# Patient Record
Sex: Male | Born: 1960 | Race: White | Hispanic: No | Marital: Married | Smoking: Never smoker
Health system: Southern US, Community
[De-identification: ages and names within clinical notes are randomized; demographics above are authoritative.]

## PROBLEM LIST (undated history)

## (undated) ENCOUNTER — Ambulatory Visit: Payer: Self-pay

## (undated) DIAGNOSIS — N419 Inflammatory disease of prostate, unspecified: Secondary | ICD-10-CM

## (undated) DIAGNOSIS — N529 Male erectile dysfunction, unspecified: Secondary | ICD-10-CM

## (undated) DIAGNOSIS — K219 Gastro-esophageal reflux disease without esophagitis: Secondary | ICD-10-CM

## (undated) DIAGNOSIS — N400 Enlarged prostate without lower urinary tract symptoms: Secondary | ICD-10-CM

## (undated) DIAGNOSIS — N320 Bladder-neck obstruction: Secondary | ICD-10-CM

## (undated) HISTORY — DX: Gastro-esophageal reflux disease without esophagitis: K21.9

## (undated) HISTORY — DX: Male erectile dysfunction, unspecified: N52.9

## (undated) HISTORY — DX: Benign prostatic hyperplasia without lower urinary tract symptoms: N40.0

## (undated) HISTORY — DX: Inflammatory disease of prostate, unspecified: N41.9

## (undated) HISTORY — DX: Bladder-neck obstruction: N32.0

## (undated) HISTORY — PX: NASAL SINUS SURGERY: SHX719

---

## 2007-02-02 ENCOUNTER — Ambulatory Visit: Payer: Self-pay | Admitting: Emergency Medicine

## 2007-02-02 ENCOUNTER — Emergency Department: Payer: Self-pay | Admitting: Internal Medicine

## 2007-02-02 ENCOUNTER — Other Ambulatory Visit: Payer: Self-pay

## 2007-02-04 ENCOUNTER — Encounter: Payer: Self-pay | Admitting: Cardiovascular Disease

## 2007-02-04 ENCOUNTER — Ambulatory Visit: Payer: Self-pay | Admitting: Internal Medicine

## 2008-06-01 IMAGING — CR DG CHEST 1V PORT
1 series · 1 of 1 positions shown · non-contrast
Comparison: none

REASON FOR EXAM: CHEST PAIN--PT IN [HOSPITAL]
COMMENTS:

[view not recorded]
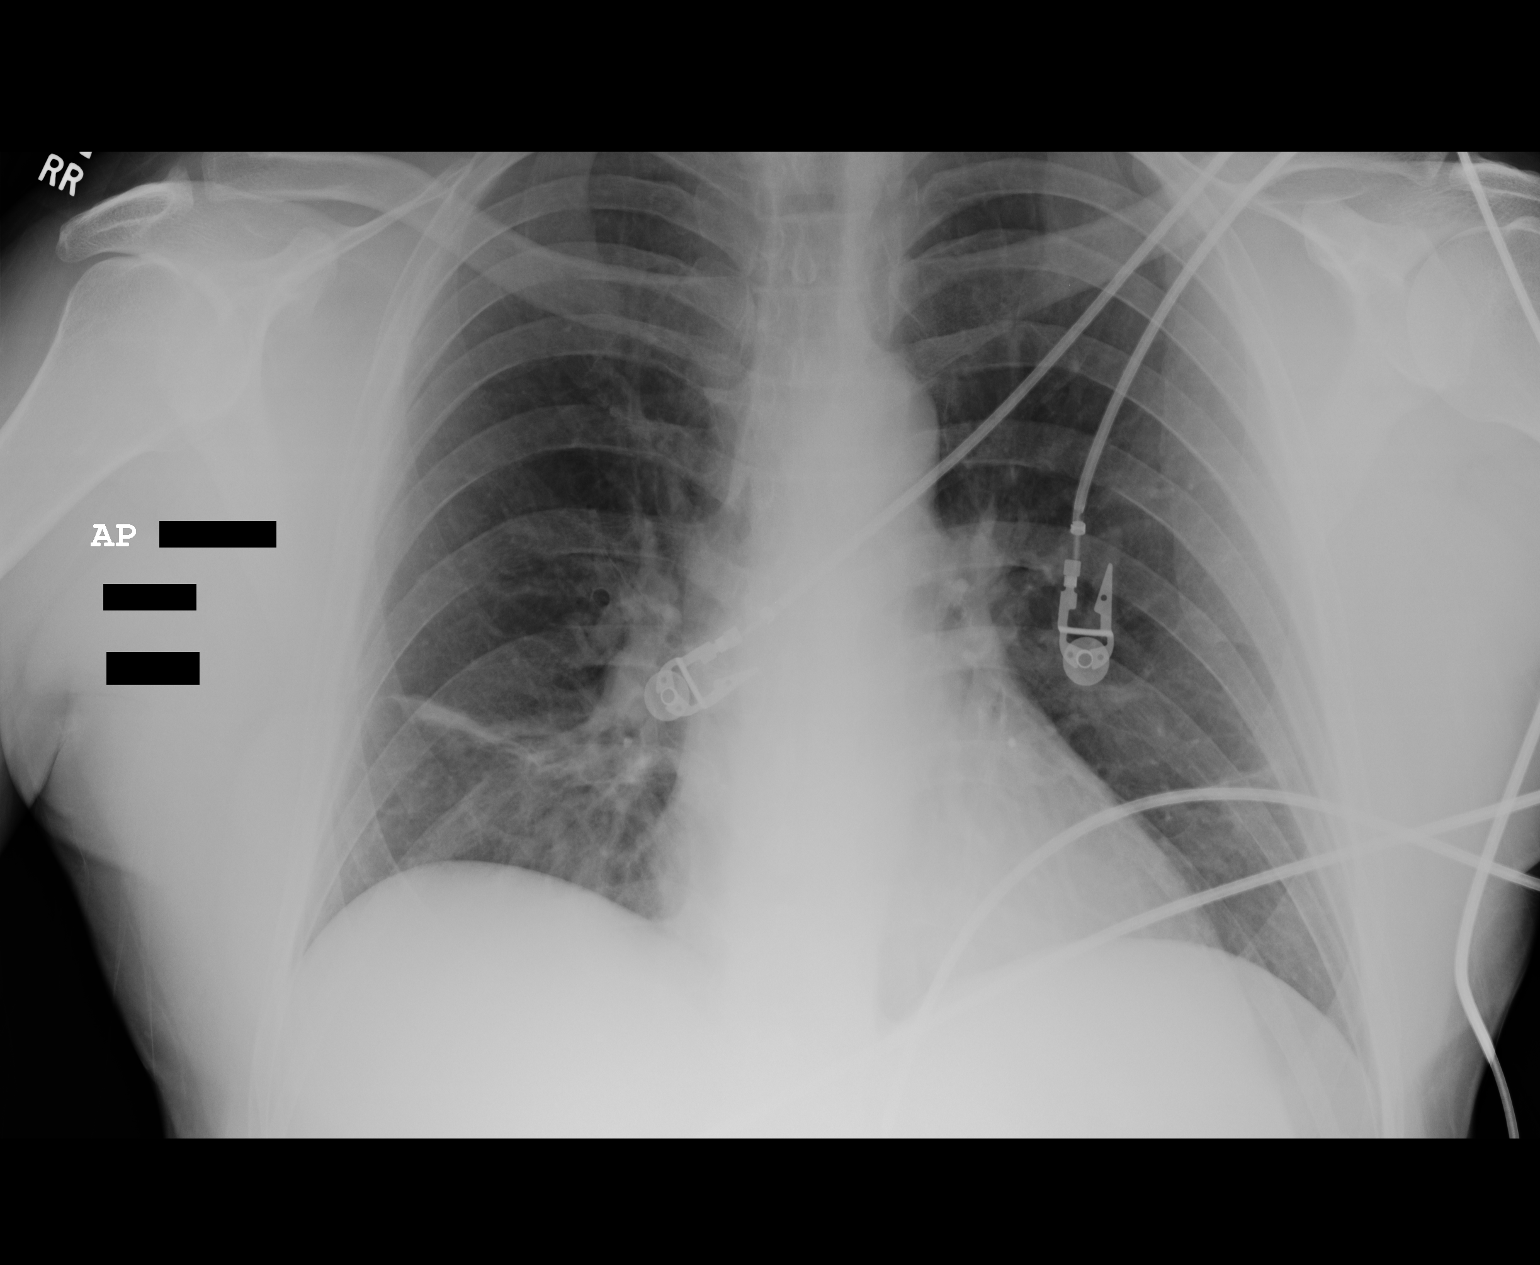

[1 of 1 positions shown; findings below may reference images not displayed]

PROCEDURE:     DXR - DXR PORTABLE CHEST SINGLE VIEW  - February 02, 2007  [DATE]

RESULT:     A single view demonstrates linear density at the right lung base
suggestive of atelectasis or fibrosis. The study is otherwise unremarkable
with a normal appearing cardiac silhouette. The lungs are otherwise clear.
Cardiac monitoring electrodes are present.
IMPRESSION: Right basilar atelectasis or linear fibrosis. Followup PA
and lateral views of the chest are recommended.

## 2008-06-01 IMAGING — CR DG CHEST 2V
1 series · 2 of 2 positions shown · non-contrast
Comparison: none

REASON FOR EXAM: cp
COMMENTS:

[Series 1: view not recorded · 0.17mm/px · 2 of 2 slices shown]
[im 1/2]
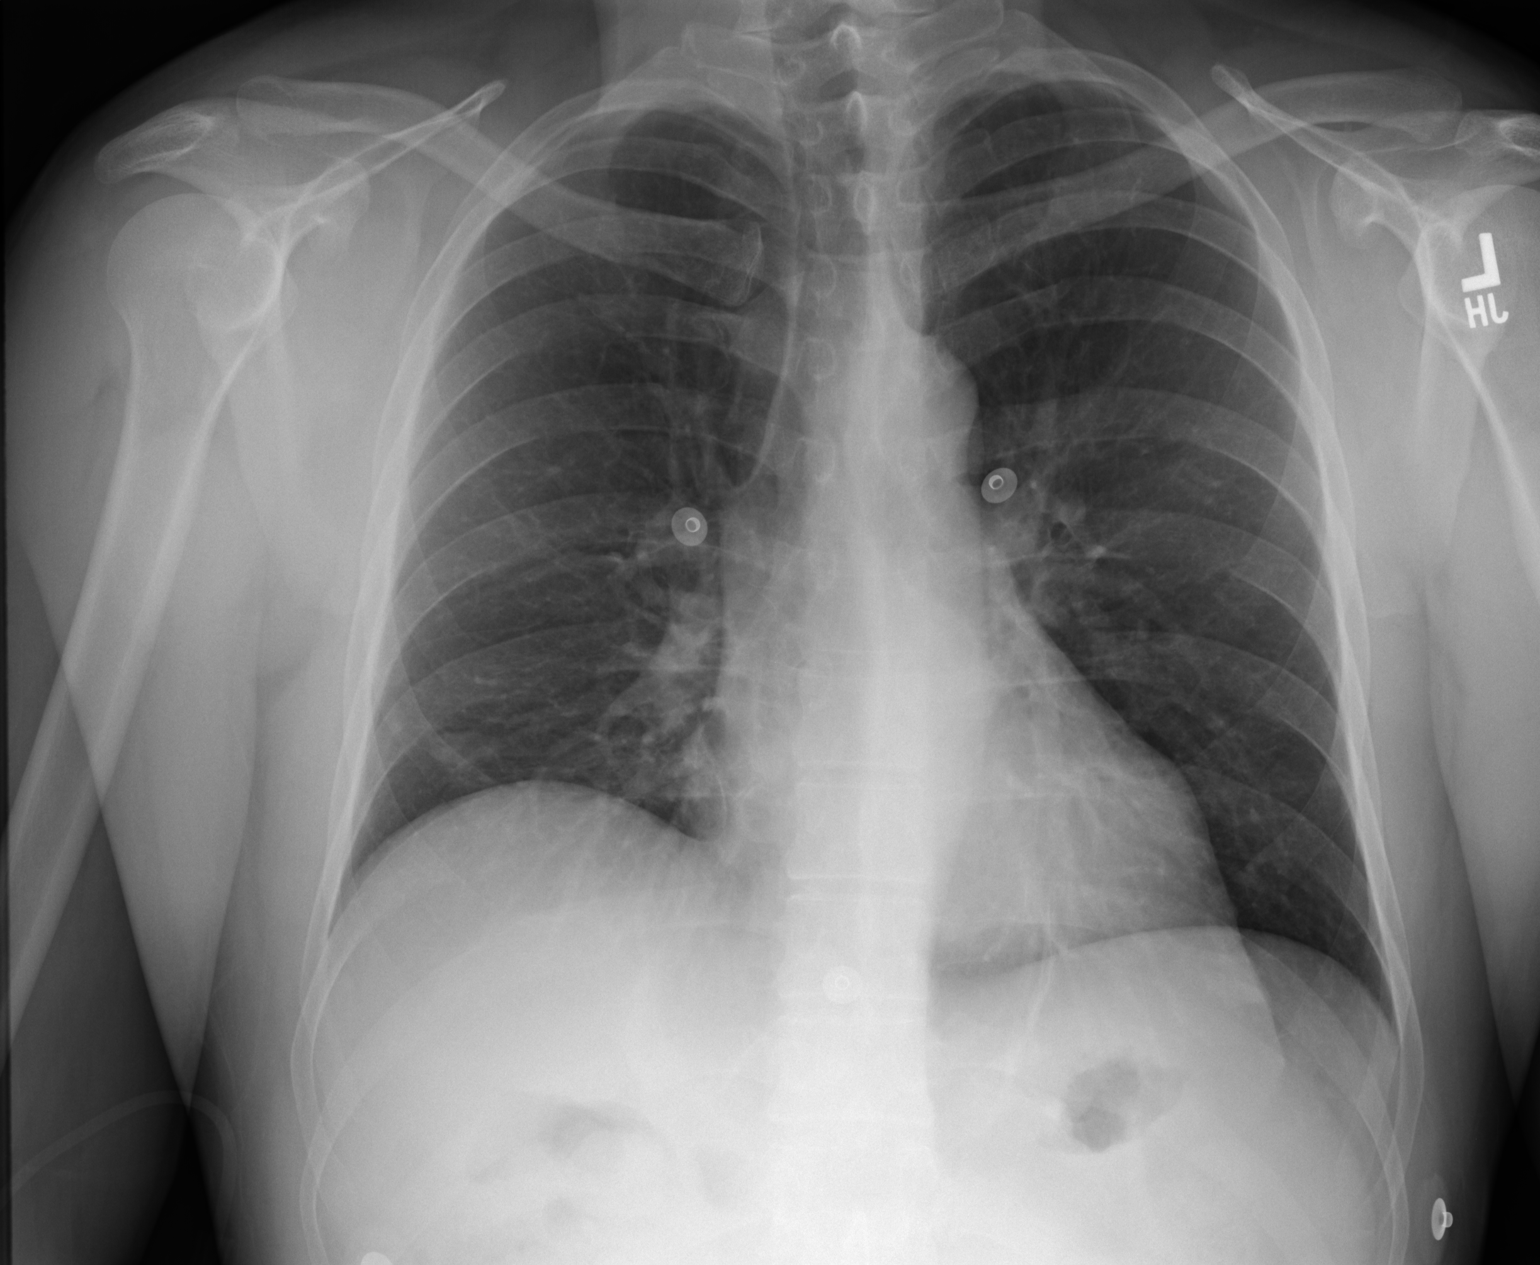
[im 2/2]
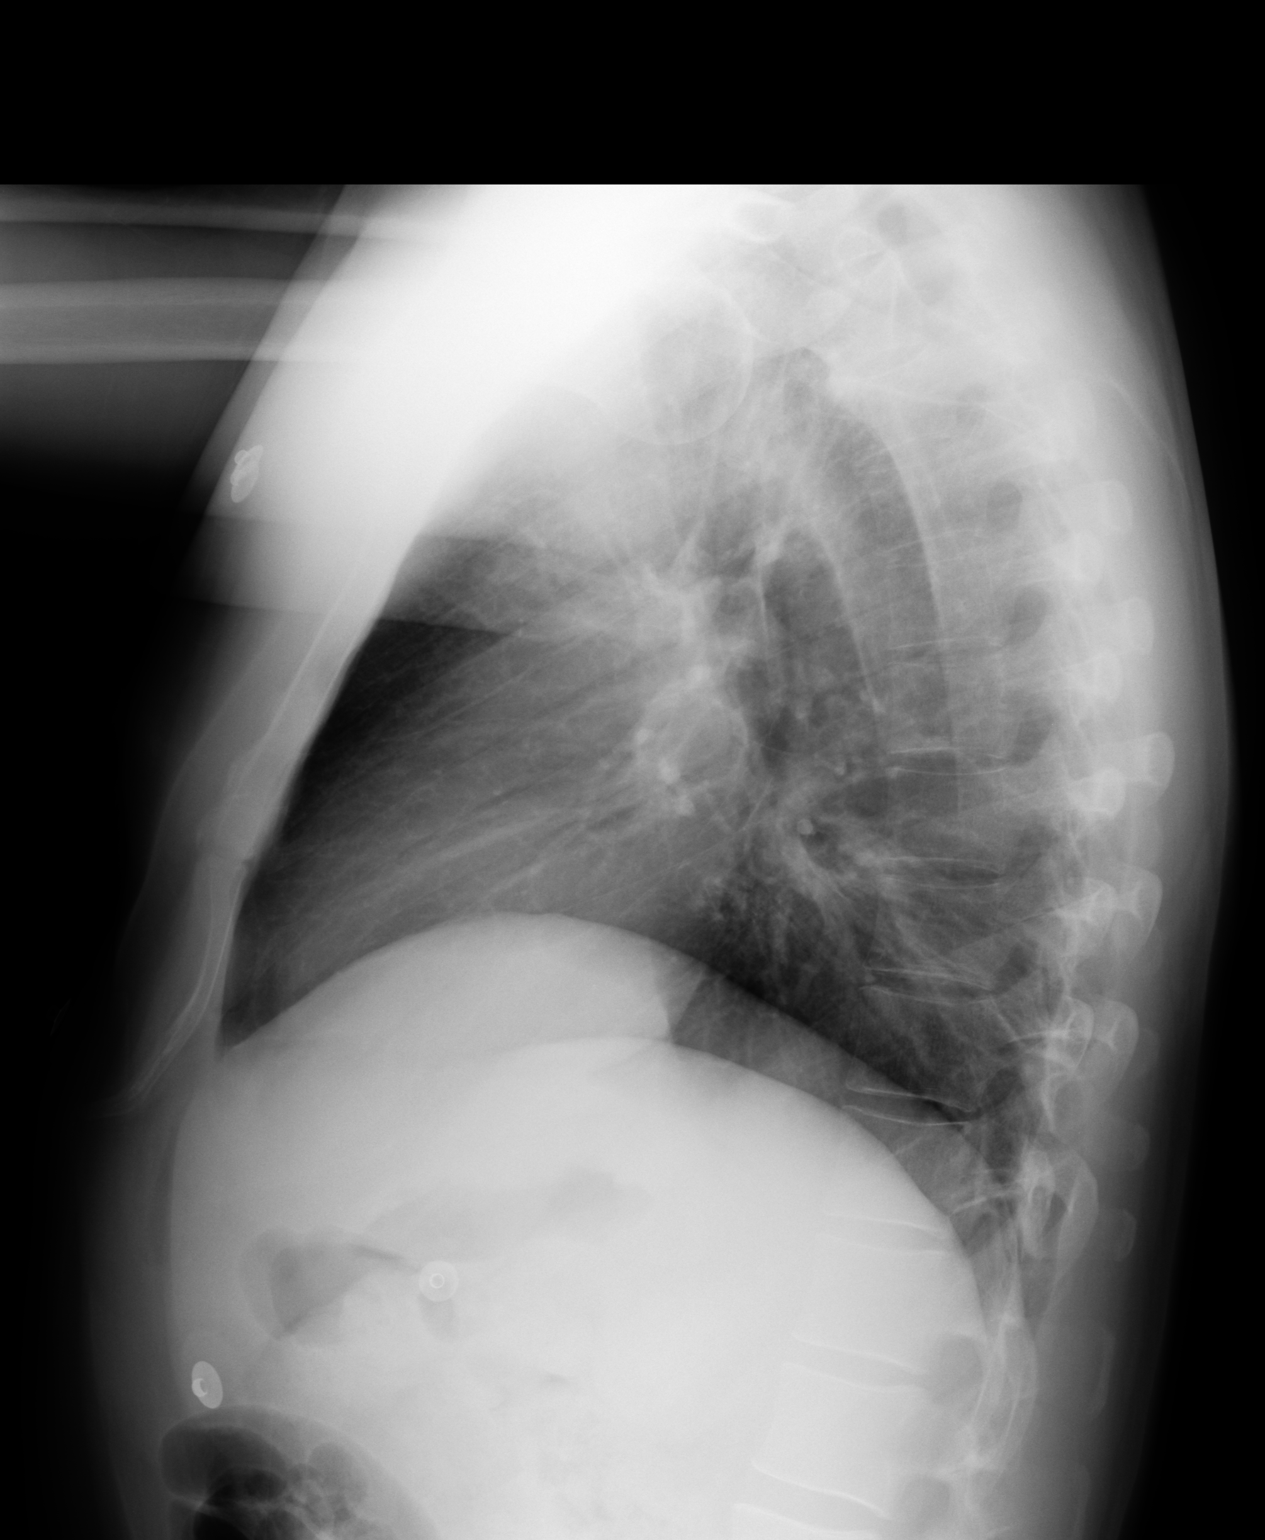

[2 of 2 positions shown; findings below may reference images not displayed]

PROCEDURE:     DXR - DXR CHEST PA (OR AP) AND LATERAL  - February 02, 2007  [DATE]

RESULT:     PA and lateral images show ill-defined increased density in the
right lower lobe consistent with developing or minimal pneumonia. Followup
images in 4 to 6 weeks would be recommended to document clearing. The heart
is normal in size. The pulmonary vasculature is normal. The remainder of the
lungs are clear.
IMPRESSION: Minimal right lower lobe pneumonia. Atelectasis could give
a similar appearance. Followup to document clearing is recommended.

## 2009-03-16 ENCOUNTER — Ambulatory Visit: Payer: Self-pay | Admitting: Urology

## 2009-06-29 ENCOUNTER — Ambulatory Visit: Payer: Self-pay | Admitting: Family

## 2011-04-23 ENCOUNTER — Emergency Department: Payer: Self-pay

## 2011-04-23 ENCOUNTER — Encounter: Payer: Self-pay | Admitting: Cardiovascular Disease

## 2011-04-27 ENCOUNTER — Ambulatory Visit (INDEPENDENT_AMBULATORY_CARE_PROVIDER_SITE_OTHER): Payer: BC Managed Care – PPO | Admitting: Cardiovascular Disease

## 2011-04-27 ENCOUNTER — Encounter: Payer: Self-pay | Admitting: Cardiovascular Disease

## 2011-04-27 DIAGNOSIS — R0600 Dyspnea, unspecified: Secondary | ICD-10-CM | POA: Insufficient documentation

## 2011-04-27 DIAGNOSIS — R079 Chest pain, unspecified: Secondary | ICD-10-CM | POA: Insufficient documentation

## 2011-04-27 DIAGNOSIS — R0602 Shortness of breath: Secondary | ICD-10-CM

## 2011-04-27 NOTE — Assessment & Plan Note (Signed)
Chest pain is atypical in nature. He does have some reproducible chest pain with palpation of his left chest. This is similar to his previous pain earlier in the week. He did have pain with certain positions, T-wave inversion in aVL, concerning for pericarditis. Unable to exclude bad lead placement done at the urgent care. The following 2 EKGs were essentially normal with no T-wave abnormality.   We have suggested he try nonsteroidal anti-inflammatories first given the reproducible chest pain with palpation. If he continues to have chest pain symptoms, we have suggested a treadmill study, possibly an echocardiogram.

## 2011-04-27 NOTE — Assessment & Plan Note (Signed)
Shortness of breath started after the onset of chest pain by one day. Etiology is uncertain. No signs of heart failure. He does have fatigue. Certainly could be secondary to pericarditis, even stress. We did offer her an echocardiogram. He will monitor his symptoms for now and call us if he would like further testing.

## 2011-04-27 NOTE — Patient Instructions (Addendum)
You are doing well. Please consider starting aspirin 81 daily Take aleve daily for chest pain (possible pericarditis) as needed Please call us if you have new issues that need to be addressed before your next appt.

## 2011-04-27 NOTE — Progress Notes (Signed)
   Patient ID: Frank KAMAU, male    DOB: [birth year], 50 y.o.   MRN: [id number]  HPI Comments: Frank Perkins is a very pleasant 50 year old Frank Perkins with no significant past medical history apart from GERD he does not have a strong family history of coronary artery disease apart from the uncle who had an MI but was a heavy smoker who presents for evaluation of chest pain and shortness of breath.  He reports that one week ago, he developed some chest tightness. He had this chest pain for several days, 4/10 in intensity. He developed shortness of breath 6 days ago and this also continued for several days. He did have sharp pain in his left arm when he raised his arms over his head. His pain has been better since Tuesday night. He did go to the emergency room on Tuesday after being seen in urgent care. Urgentcare noted T wave inversion in one and aVL.  In the emergency room, he had a normal EKG with no T wave inversions, cardiac enzymes were negative.  Since then, he continues to have some fatigue and shortness of breath. No chest pain. 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  EKG today shows normal sinus rhythm with rate of 70 beats a minute with no significant ST or T wave changes   Outpatient Encounter Prescriptions as of 04/27/2011  Medication Sig Dispense Refill  . Omeprazole-Sodium Bicarbonate (ZEGERID PO) Take by mouth.           Review of Systems  Constitutional: Negative.   HENT: Negative.   Eyes: Negative.   Respiratory: Positive for shortness of breath.   Cardiovascular: Positive for chest pain.  Gastrointestinal: Negative.   Musculoskeletal: Negative.   Skin: Negative.   Neurological: Negative.   Hematological: Negative.   Psychiatric/Behavioral: Negative.   All other systems reviewed and are negative.    BP 110/80  Pulse 73  Ht 5\' 11"  (1.803 m)  Wt 168 lb (76.204 kg)  BMI 23.43 kg/m2  Physical Exam  Nursing note and vitals  reviewed. Constitutional: He is oriented to person, place, and time. He appears well-developed and well-nourished.  HENT:  Head: Normocephalic.  Nose: Nose normal.  Mouth/Throat: Oropharynx is clear and moist.  Eyes: Conjunctivae are normal. Pupils are equal, round, and reactive to light.  Neck: Normal range of motion. Neck supple. No JVD present.  Cardiovascular: Normal rate, regular rhythm, S1 normal, S2 normal, normal heart sounds and intact distal pulses.  Exam reveals no gallop and no friction rub.   No murmur heard. Pulmonary/Chest: Effort normal and breath sounds normal. No respiratory distress. He has no wheezes. He has no rales. He exhibits no tenderness.  Abdominal: Soft. Bowel sounds are normal. He exhibits no distension. There is no tenderness.  Musculoskeletal: Normal range of motion. He exhibits no edema and no tenderness.  Lymphadenopathy:    He has no cervical adenopathy.  Neurological: He is alert and oriented to person, place, and time. Coordination normal.  Skin: Skin is warm and dry. No rash noted. No erythema.  Psychiatric: He has a normal mood and affect. His behavior is normal. Judgment and thought content normal.           Assessment and Plan

## 2011-12-18 ENCOUNTER — Telehealth: Payer: Self-pay | Admitting: Internal Medicine

## 2011-12-18 ENCOUNTER — Ambulatory Visit: Payer: BC Managed Care – PPO | Admitting: Internal Medicine

## 2011-12-18 NOTE — Telephone Encounter (Signed)
Please add him on for today after Sharon [place]

## 2011-12-18 NOTE — Telephone Encounter (Signed)
Left message for pt to call office

## 2011-12-18 NOTE — Telephone Encounter (Signed)
Caller: Trudy/Spouse; PCP: Duncan Dull; CB#: [phone]. Call regarding sinus congestion.  Spouse calling for an appt with another provider after being advised no appts availlable with PCP until Friday. Caller reports he feels like he has a sinus infection and home care is not helping with sxs. He would like to be seen in the next 48 hrs. Caller reports she spoke with staff at appt desk and asked that he be placed on the "call us if you have a cancellation list." No open appts in the next 2 days in [facility]. RN spoke with Zella Ball at appt desk, she does not see a note re this issue. Transferred to Dr Melina Schools nurse, call went to VM.  RN left message re reason for call: to be sure he is on the cancellation list for a callback if appt becomes available.

## 2011-12-18 NOTE — Telephone Encounter (Signed)
Spoke with patient.  Pt went to another office

## 2012-10-24 ENCOUNTER — Encounter: Payer: Self-pay | Admitting: Internal Medicine

## 2012-10-24 ENCOUNTER — Ambulatory Visit (INDEPENDENT_AMBULATORY_CARE_PROVIDER_SITE_OTHER): Payer: BC Managed Care – PPO | Admitting: Internal Medicine

## 2012-10-24 VITALS — BP 130/82 | HR 77 | Temp 97.8°F | Resp 16 | Ht 71.0 in | Wt 170.5 lb

## 2012-10-24 DIAGNOSIS — R5383 Other fatigue: Secondary | ICD-10-CM

## 2012-10-24 DIAGNOSIS — Z13220 Encounter for screening for lipoid disorders: Secondary | ICD-10-CM

## 2012-10-24 DIAGNOSIS — Z125 Encounter for screening for malignant neoplasm of prostate: Secondary | ICD-10-CM

## 2012-10-24 DIAGNOSIS — Z0000 Encounter for general adult medical examination without abnormal findings: Secondary | ICD-10-CM | POA: Insufficient documentation

## 2012-10-24 DIAGNOSIS — R1314 Dysphagia, pharyngoesophageal phase: Secondary | ICD-10-CM

## 2012-10-24 DIAGNOSIS — R5381 Other malaise: Secondary | ICD-10-CM

## 2012-10-24 LAB — COMPREHENSIVE METABOLIC PANEL
AST: 29 U/L (ref 0–37)
Albumin: 4.1 g/dL (ref 3.5–5.2)
BUN: 14 mg/dL (ref 6–23)
Calcium: 8.8 mg/dL (ref 8.4–10.5)
Chloride: 105 mEq/L (ref 96–112)
Glucose, Bld: 97 mg/dL (ref 70–99)
Potassium: 4.2 mEq/L (ref 3.5–5.1)

## 2012-10-24 LAB — CBC WITH DIFFERENTIAL/PLATELET
Basophils Relative: 0.4 % (ref 0.0–3.0)
Eosinophils Relative: 0.4 % (ref 0.0–5.0)
HCT: 42.8 % (ref 39.0–52.0)
Hemoglobin: 14.9 g/dL (ref 13.0–17.0)
Lymphs Abs: 1.6 10*3/uL (ref 0.7–4.0)
MCV: 94 fl (ref 78.0–100.0)
Monocytes Absolute: 0.5 10*3/uL (ref 0.1–1.0)
Neutro Abs: 2.2 10*3/uL (ref 1.4–7.7)
Platelets: 218 10*3/uL (ref 150.0–400.0)
WBC: 4.3 10*3/uL — ABNORMAL LOW (ref 4.5–10.5)

## 2012-10-24 NOTE — Progress Notes (Addendum)
Patient ID: Frank Perkins, male   DOB: 1961/06/25, 52 y.o.   MRN: [id number]  The patient is here for his annual male physical examination and to establish care .    Recovering from sinusitis.  He is under treatment by Vernie Murders.  He was initiall treated with augmentin,which made him nauseated, but he finsihed the antibiotics and his symptoms improved markedly, then returned with sinus pain and purulent sputum.  He is currently taking Cefdinir without any adverse effects including diarrhea.  Recent episode of bilateral leg pain in thighs,  Aggravated by sitting in a recliner with elevation of legs. .  Resolved with daily aspirin 81 mg    Occasional trouble swallowing,,  No choking,  But occasionally notes that things tend to have a hard time to all way down. He does not it more often with large boluses and textured foods such as bread. Dr. Adron Bene looked down to the level of his focal cords and did not see any polyps or stenoses.  The risk factors are reflected in the social history.  The roster of all physicians providing medical care to patient - is listed in the Snapshot section of the chart.  Activities of daily living:  The patient is 100% independent in all ADLs: dressing, toileting, feeding as well as independent mobility  Home safety : The patient has smoke detectors in the home. He wears seatbelts.  There are no firearms at home. There is no violence in the home.   There is no risks for hepatitis, STDs or HIV. There is no   history of blood transfusion. There is no travel history to infectious disease endemic areas of the world.  The patient has seen their dentist in the last six month and  their eye doctor in the last year.  They do not  have excessive sun exposure. They have seen a dermatoloigist in the last year. Discussed the need for sun protection: hats, long sleeves and use of sunscreen if there is significant sun exposure.   Diet: the importance of a healthy diet is  discussed. They do have a healthy diet.  The benefits of regular aerobic exercise were discussed. He exercises a minimum of 30 minutes  5 days per week. Depression screen: there are no signs or vegative symptoms of depression- irritability, change in appetite, anhedonia, sadness/tearfullness.  The following portions of the patient's history were reviewed and updated as appropriate: allergies, current medications, past family history, past medical history,  past surgical history, past social history  and problem list.  Visual acuity was not assessed per patient preference since he has regular follow up with his ophthalmologist. Hearing and body mass index were assessed and reviewed.   During the course of the visit the patient was educated and counseled about appropriate screening and preventive services including :  nutrition counseling, colorectal cancer screening, and recommended immunizations.    Objective:  BP 130/82  Pulse 77  Temp(Src) 97.8 F (36.6 C) (Oral)  Resp 16  Ht 5\' 11"  (1.803 m)  Wt 170 lb 8 oz (77.338 kg)  BMI 23.79 kg/m2  General Appearance:    Alert, cooperative, no distress, appears stated age  Head:    Normocephalic, without obvious abnormality, atraumatic  Eyes:    PERRL, conjunctiva/corneas clear, EOM's intact, fundi    benign, both eyes       Ears:    Normal TM's and external ear canals, both ears  Nose:   Nares normal, septum midline, mucosa normal,  no drainage   or sinus tenderness  Throat:   Lips, mucosa, and tongue normal; teeth and gums normal  Neck:   Supple, symmetrical, trachea midline, no adenopathy;       thyroid:  No enlargement/tenderness/nodules; no carotid   bruit or JVD  Back:     Symmetric, no curvature, ROM normal, no CVA tenderness  Lungs:     Clear to auscultation bilaterally, respirations unlabored  Chest wall:    No tenderness or deformity  Heart:    Regular rate and rhythm, S1 and S2 normal, no murmur, rub   or gallop  Abdomen:      Soft, non-tender, bowel sounds active all four quadrants,    no masses, no organomegaly  Extremities:   Extremities normal, atraumatic, no cyanosis or edema  Pulses:   2+ and symmetric all extremities  Skin:   Skin color, texture, turgor normal, no rashes or lesions  Lymph nodes:   Cervical, supraclavicular, and axillary nodes normal  Neurologic:   CNII-XII intact. Normal strength, sensation and reflexes      throughout   Assessment and Plan Routine general medical examination at a health care facility Annual physical was done excluding the testicular and prostate exam since this was done in September 2013 by Dr. Orson Slick and PSA was checked at that time.  Colon ca screening was reviewed and options given.    Chest pain He had an evaluation in October 2012 for chest pain but deferred stress test that was offered by Dr. Orpah Clinton. He's had no recurrence of chest pain since then. Fasting labs are due.  Dysphagia, pharyngoesophageal phase I have recommended a barium swallow study the patient like to coordinate this at a later date with his work schedule. In meantime I recommended daily use of a proton pump inhibitor   Updated Medication List Outpatient Encounter Prescriptions as of 10/24/2012  Medication Sig Dispense Refill  . cefdinir (OMNICEF) 300 MG capsule Take 300 mg by mouth 2 (two) times daily.      . Multiple Vitamin (MULTIVITAMIN) capsule Take 1 capsule by mouth daily.      Maxwell Caul Bicarbonate (ZEGERID PO) Take by mouth.         No facility-administered encounter medications on file as of 10/24/2012.

## 2012-10-24 NOTE — Patient Instructions (Addendum)
Let us know when when you want to have the barium swallow evaluation (upper GI) .  We are doing nonfasting labs today   Sign up for my chart to access your labs ,  Chart etc.

## 2012-10-25 LAB — TSH+FREE T4: Free T4: 1.69 ng/dL (ref 0.82–1.77)

## 2012-10-26 ENCOUNTER — Encounter: Payer: Self-pay | Admitting: Internal Medicine

## 2012-10-26 DIAGNOSIS — R1314 Dysphagia, pharyngoesophageal phase: Secondary | ICD-10-CM | POA: Insufficient documentation

## 2012-10-26 NOTE — Assessment & Plan Note (Signed)
He had an evaluation in October 2012 for chest pain but deferred stress test that was offered by Dr. Orpah Clinton. He's had no recurrence of chest pain since then. Fasting labs are due.

## 2012-10-26 NOTE — Assessment & Plan Note (Signed)
I have recommended a barium swallow study the patient like to coordinate this at a later date with his work schedule. In meantime I recommended daily use of a proton pump inhibitor

## 2012-10-26 NOTE — Assessment & Plan Note (Signed)
Annual physical was done excluding the testicular and prostate exam since this was done in September 2013 by Dr. Orson Slick and PSA was checked at that time.  Colon ca screening was reviewed and options given.

## 2012-10-27 ENCOUNTER — Ambulatory Visit: Payer: BC Managed Care – PPO

## 2012-10-27 ENCOUNTER — Encounter: Payer: Self-pay | Admitting: General Practice

## 2012-10-27 DIAGNOSIS — Z125 Encounter for screening for malignant neoplasm of prostate: Secondary | ICD-10-CM

## 2012-10-27 LAB — PSA: PSA: 2.22 ng/mL (ref 0.10–4.00)

## 2012-10-27 NOTE — Progress Notes (Signed)
Letter mailed  To pt

## 2013-07-30 ENCOUNTER — Ambulatory Visit (INDEPENDENT_AMBULATORY_CARE_PROVIDER_SITE_OTHER): Payer: BC Managed Care – PPO | Admitting: Internal Medicine

## 2013-07-30 ENCOUNTER — Encounter: Payer: Self-pay | Admitting: Internal Medicine

## 2013-07-30 ENCOUNTER — Ambulatory Visit: Payer: BC Managed Care – PPO | Admitting: Internal Medicine

## 2013-07-30 VITALS — BP 122/80 | HR 71 | Temp 98.0°F | Wt 177.0 lb

## 2013-07-30 DIAGNOSIS — H6690 Otitis media, unspecified, unspecified ear: Secondary | ICD-10-CM

## 2013-07-30 DIAGNOSIS — J0100 Acute maxillary sinusitis, unspecified: Secondary | ICD-10-CM | POA: Insufficient documentation

## 2013-07-30 DIAGNOSIS — H6691 Otitis media, unspecified, right ear: Secondary | ICD-10-CM

## 2013-07-30 DIAGNOSIS — H6692 Otitis media, unspecified, left ear: Secondary | ICD-10-CM | POA: Insufficient documentation

## 2013-07-30 DIAGNOSIS — J019 Acute sinusitis, unspecified: Secondary | ICD-10-CM | POA: Insufficient documentation

## 2013-07-30 MED ORDER — ANTIPYRINE-BENZOCAINE 5.4-1.4 % OT SOLN: 3.0000 [drp] | OTIC | Status: DC | PRN

## 2013-07-30 MED ORDER — AMOXICILLIN-POT CLAVULANATE 875-125 MG PO TABS: 1.0000 | ORAL_TABLET | Freq: Two times a day (BID) | ORAL | Status: DC

## 2013-07-30 MED ORDER — PREDNISONE (PAK) 10 MG PO TABS: ORAL_TABLET | ORAL | Status: DC

## 2013-07-30 NOTE — Patient Instructions (Signed)

## 2013-07-30 NOTE — Assessment & Plan Note (Signed)
Exam consistent with right maxillary sinusitis and right otitis media. Will treat with prednisone and Augmentin. Will use Auralgan and ibuprofen as needed for pain. Followup for recheck in 2-4 weeks or sooner if no improvement.

## 2013-07-30 NOTE — Progress Notes (Signed)
Pre-visit discussion using our clinic review tool. No additional management support is needed unless otherwise documented below in the visit note.  

## 2013-07-30 NOTE — Assessment & Plan Note (Signed)
As above, exam consistent with right otitis media and maxillary sinusitis. Treat with prednisone and Augmentin. Return to clinic for recheck in 2-4 weeks or sooner as needed.

## 2013-07-30 NOTE — Progress Notes (Signed)
   Subjective:    Patient ID: Frank Perkins, male    DOB: April 07, 1961, 53 y.o.   MRN: [id number]  HPI 53 year old male with history of recurrent sinusitis, status post sinus surgery presents for acute visit complaining of right sided sinus pressure, pain, green mucous x 1 month. Occasional bleeding. H/o previous sinus surgery with Dr. Elenore RotaJuengel 5 years ago. No fever, chills, dyspnea.  Occasional cough at night. He has tried over-the-counter cough and cold medications with no improvement.  Outpatient Prescriptions Prior to Visit  Medication Sig Dispense Refill  . Multiple Vitamin (MULTIVITAMIN) capsule Take 1 capsule by mouth daily.      Maxwell Caul. Omeprazole-Sodium Bicarbonate (ZEGERID PO) Take by mouth.         No facility-administered medications prior to visit.   BP 122/80  Pulse 71  Temp(Src) 98 F (36.7 C) (Oral)  Wt 177 lb (80.287 kg)  SpO2 97%  Review of Systems  Constitutional: Positive for fatigue. Negative for fever, chills and activity change.  HENT: Positive for congestion and sinus pressure. Negative for ear discharge, ear pain, hearing loss, nosebleeds, postnasal drip, rhinorrhea, sneezing, sore throat, tinnitus, trouble swallowing and voice change.   Eyes: Negative for discharge, redness, itching and visual disturbance.  Respiratory: Positive for cough. Negative for chest tightness, shortness of breath, wheezing and stridor.   Cardiovascular: Negative for chest pain and leg swelling.  Musculoskeletal: Negative for arthralgias, myalgias, neck pain and neck stiffness.  Skin: Negative for color change and rash.  Neurological: Positive for headaches. Negative for dizziness and facial asymmetry.  Psychiatric/Behavioral: Negative for sleep disturbance.       Objective:   Physical Exam  Constitutional: He is oriented to person, place, and time. He appears well-developed and well-nourished. No distress.  HENT:  Head: Normocephalic and atraumatic.  Right Ear: External ear normal.  Tympanic membrane is erythematous and bulging. A middle ear effusion is present.  Left Ear: Tympanic membrane and external ear normal.  Nose: Right sinus exhibits maxillary sinus tenderness and frontal sinus tenderness. Left sinus exhibits no maxillary sinus tenderness and no frontal sinus tenderness.  Mouth/Throat: Oropharynx is clear and moist. No oropharyngeal exudate.  Eyes: Conjunctivae and EOM are normal. Pupils are equal, round, and reactive to light. Right eye exhibits no discharge. Left eye exhibits no discharge. No scleral icterus.  Neck: Normal range of motion. Neck supple. No tracheal deviation present. No thyromegaly present.  Cardiovascular: Normal rate, regular rhythm and normal heart sounds.  Exam reveals no gallop and no friction rub.   No murmur heard. Pulmonary/Chest: Effort normal and breath sounds normal. No accessory muscle usage. Not tachypneic. No respiratory distress. He has no decreased breath sounds. He has no wheezes. He has no rhonchi. He has no rales. He exhibits no tenderness.  Musculoskeletal: Normal range of motion. He exhibits no edema.  Lymphadenopathy:    He has no cervical adenopathy.  Neurological: He is alert and oriented to person, place, and time. No cranial nerve deficit. Coordination normal.  Skin: Skin is warm and dry. No rash noted. He is not diaphoretic. No erythema. No pallor.  Psychiatric: He has a normal mood and affect. His behavior is normal. Judgment and thought content normal.          Assessment & Plan:

## 2014-04-14 ENCOUNTER — Ambulatory Visit: Payer: BC Managed Care – PPO | Admitting: Internal Medicine

## 2014-04-16 ENCOUNTER — Encounter: Payer: Self-pay | Admitting: Internal Medicine

## 2014-04-16 ENCOUNTER — Ambulatory Visit (INDEPENDENT_AMBULATORY_CARE_PROVIDER_SITE_OTHER): Payer: BC Managed Care – PPO | Admitting: Internal Medicine

## 2014-04-16 VITALS — BP 120/76 | HR 74 | Temp 98.2°F | Resp 16 | Ht 71.0 in | Wt 172.8 lb

## 2014-04-16 DIAGNOSIS — H65119 Acute and subacute allergic otitis media (mucoid) (sanguinous) (serous), unspecified ear: Secondary | ICD-10-CM

## 2014-04-16 DIAGNOSIS — R0789 Other chest pain: Secondary | ICD-10-CM

## 2014-04-16 DIAGNOSIS — H65112 Acute and subacute allergic otitis media (mucoid) (sanguinous) (serous), left ear: Secondary | ICD-10-CM

## 2014-04-16 DIAGNOSIS — R071 Chest pain on breathing: Secondary | ICD-10-CM

## 2014-04-16 DIAGNOSIS — R222 Localized swelling, mass and lump, trunk: Secondary | ICD-10-CM

## 2014-04-16 DIAGNOSIS — K130 Diseases of lips: Secondary | ICD-10-CM

## 2014-04-16 MED ORDER — PREDNISONE (PAK) 10 MG PO TABS: ORAL_TABLET | ORAL | Status: DC

## 2014-04-16 MED ORDER — LEVOFLOXACIN 500 MG PO TABS: 500.0000 mg | ORAL_TABLET | Freq: Every day | ORAL | Status: DC

## 2014-04-16 NOTE — Progress Notes (Signed)
Patient ID: Frank Perkins, male   DOB: [birth year], 53 y.o.   MRN: [id number]   Patient Active Problem List   Diagnosis Date Noted  . Lip lesion 04/18/2014  . Anterior chest wall pain 04/18/2014  . Left otitis media 07/30/2013  . Dysphagia, pharyngoesophageal phase 10/26/2012  . Routine general medical examination at a health care facility 10/24/2012  . Chest pain 04/27/2011    Subjective:  CC:   Chief Complaint  Patient presents with  . Otalgia    left ear pain. drainage in throat, bump on left side of chest     HPI:  Frank Perkins is a 53 y.o. male who presents for  Evaluation of Multiple issues.  He was last seen by me in April 2014  Treated for sinusitis  And otitis media b.  y Dr Dan Humphreys in January  with prednisone and augmentin several months ago.  Then 2 weeks ago, his  left ear started hurting again   2) Lip lesion lower lip  Present and non healing for the past 2 weeks  Dips snuff  3)  Left anterior chest wall pain s tarted in June with pain  to touch and pain with twisting motion.  The pain has resolved but he continues to have a mass on his chest wall that is present without discoloration .    Past Medical History  Diagnosis Date  . GERD (gastroesophageal reflux disease)     No past surgical history on file.     The following portions of the patient's history were reviewed and updated as appropriate: Allergies, current medications, and problem list.    Review of Systems:   Patient denies headache, fevers, malaise, unintentional weight loss, skin rash, eye pain, sinus congestion and sinus pain, sore throat, dysphagia,  hemoptysis , cough, dyspnea, wheezing, chest pain, palpitations, orthopnea, edema, abdominal pain, nausea, melena, diarrhea, constipation, flank pain, dysuria, hematuria, urinary  Frequency, nocturia, numbness, tingling, seizures,  Focal weakness, Loss of consciousness,  Tremor, insomnia, depression, anxiety, and suicidal ideation.      History   Social History  . Marital Status: Married    Spouse Name: N/A    Number of Children: N/A  . Years of Education: N/A   Occupational History  . Not on file.   Social History Main Topics  . Smoking status: Never Smoker   . Smokeless tobacco: Not on file  . Alcohol Use: 0.5 oz/week    1 drink(s) per week  . Drug Use: No  . Sexual Activity: Not on file   Other Topics Concern  . Not on file   Social History Narrative  . No narrative on file    Objective:  Filed Vitals:   04/16/14 1539  BP: 120/76  Pulse: 74  Temp: 98.2 F (36.8 C)  Resp: 16     General appearance: alert, cooperative and appears stated age Ears: normal TM's and external ear canals both ears Throat: lips, mucosa, and tongue normal; teeth and gums normal Neck: no adenopathy, no carotid bruit, supple, symmetrical, trachea midline and thyroid not enlarged, symmetric, no tenderness/mass/nodules Back: symmetric, no curvature. ROM normal. No CVA tenderness. Lungs: clear to auscultation bilaterally Heart: regular rate and rhythm, S1, S2 normal, no murmur, click, rub or gallop Abdomen: soft, non-tender; bowel sounds normal; no masses,  no organomegaly Pulses: 2+ and symmetric Skin: Skin color, texture, turgor normal. No rashes or lesions Lymph nodes: Cervical, supraclavicular, and axillary nodes normal.  Assessment and Plan:  Left otitis  media Will treat with levaquin and prednisone  Lip lesion His lower lip has been bleeding and scaling for 2 weeks.  Given his history of  dipping snuff,  Need to rule out squamous cell Ca.  Refer to dermatology.   Anterior chest wall pain He has a nontender mass under his left breast that is not pigmented. Chest x ray ordered to rule out bone lesion. Will need gen surg evaluation of excisional biopsy.   Updated Medication List Outpatient Encounter Prescriptions as of 04/16/2014  Medication Sig  . antipyrine-benzocaine (AURALGAN) otic solution Place 3-4  drops into the right ear every 2 (two) hours as needed for ear pain.  Marland Kitchen CIALIS 20 MG tablet   . esomeprazole (NEXIUM) 20 MG capsule Take 20 mg by mouth daily at 12 noon.  . Multiple Vitamin (MULTIVITAMIN) capsule Take 1 capsule by mouth daily.  . tamsulosin (FLOMAX) 0.4 MG CAPS capsule   . levofloxacin (LEVAQUIN) 500 MG tablet Take 1 tablet (500 mg total) by mouth daily.  . predniSONE (STERAPRED UNI-PAK) 10 MG tablet 6 tablets on Day 1 , then reduce by 1 tablet daily until gone  . [DISCONTINUED] amoxicillin-clavulanate (AUGMENTIN) 875-125 MG per tablet Take 1 tablet by mouth 2 (two) times daily.  . [DISCONTINUED] Omeprazole-Sodium Bicarbonate (ZEGERID PO) Take by mouth.    . [DISCONTINUED] predniSONE (STERAPRED UNI-PAK) 10 MG tablet Take  day 1 then taper by  daily     Orders Placed This Encounter  Procedures  . DG Chest 2 View  . Ambulatory referral to Dermatology    No Follow-up on file.

## 2014-04-16 NOTE — Progress Notes (Signed)
Pre-visit discussion using our clinic review tool. No additional management support is needed unless otherwise documented below in the visit note.  

## 2014-04-16 NOTE — Patient Instructions (Addendum)
I believe you may have another upper respiratory infection involving your left ear and sinuses  I am prescribing an antibiotic (levaquin) and a  prednisone taper  To manage the infection and the inflammation in your ear/sinuses.   I also advise use of the following OTC meds to help with your other symptoms.   Take generic OTC benadryl 25 mg every 8 hours for the drainage,  Sudafed PE  10 to 30 mg every 8 hours for the congestion, you may substitute Afrin nasal spray for the nighttime dose of sudafed PE  If needed to prevent insomnia.  flush your sinuses twice daily with Simply Saline or Neils's sinus rinse (do over the sink because if you do it right you will spit out globs of mucus)  Use OTC  Delsym   FOR THE COUGH.  Please take a probiotic ( Align, Floraque or Culturelle) while you are on the antibiotic to prevent a serious antibiotic associated diarrhea  Called clostridium dificile colitis  Your lip lesion may be a squamous cell Cancer from dipping snuff.  I am referring you to dermatology to evaluate this  Your chest wall mass needs further workup, starting with a chest x ay.  Go to [facility] on Monday

## 2014-04-18 DIAGNOSIS — R0789 Other chest pain: Secondary | ICD-10-CM | POA: Insufficient documentation

## 2014-04-18 DIAGNOSIS — K130 Diseases of lips: Secondary | ICD-10-CM | POA: Insufficient documentation

## 2014-04-18 NOTE — Assessment & Plan Note (Signed)
His lower lip has been bleeding and scaling for 2 weeks.  Given his history of  dipping snuff,  Need to rule out squamous cell Ca.  Refer to dermatology.

## 2014-04-18 NOTE — Assessment & Plan Note (Signed)
He has a nontender mass under his left breast that is not pigmented. Chest x ray ordered to rule out bone lesion. Will need gen surg evaluation of excisional biopsy.

## 2014-04-18 NOTE — Assessment & Plan Note (Addendum)
Will treat with levaquin and prednisone

## 2014-10-08 ENCOUNTER — Other Ambulatory Visit: Payer: Self-pay | Admitting: Internal Medicine

## 2014-11-05 ENCOUNTER — Telehealth: Payer: Self-pay | Admitting: Internal Medicine

## 2014-11-05 ENCOUNTER — Ambulatory Visit: Payer: Self-pay | Admitting: Internal Medicine

## 2014-11-05 ENCOUNTER — Ambulatory Visit (INDEPENDENT_AMBULATORY_CARE_PROVIDER_SITE_OTHER): Payer: Self-pay | Admitting: Internal Medicine

## 2014-11-05 DIAGNOSIS — Z0000 Encounter for general adult medical examination without abnormal findings: Secondary | ICD-10-CM

## 2014-11-05 NOTE — Telephone Encounter (Signed)
Left msg to call office about appt.msn

## 2014-11-05 NOTE — Progress Notes (Signed)
Patient ID: Frank Perkins, male   DOB: [birth year], 54 y.o.   MRN: [id number]   Patient no showed for  Annual exam 30 minute appt

## 2014-11-11 NOTE — Assessment & Plan Note (Signed)
patinet no showed for annual exam 30 minute appt.

## 2015-01-31 ENCOUNTER — Ambulatory Visit (INDEPENDENT_AMBULATORY_CARE_PROVIDER_SITE_OTHER): Payer: BLUE CROSS/BLUE SHIELD | Admitting: Nurse Practitioner

## 2015-01-31 ENCOUNTER — Encounter: Payer: Self-pay | Admitting: Nurse Practitioner

## 2015-01-31 DIAGNOSIS — R399 Unspecified symptoms and signs involving the genitourinary system: Secondary | ICD-10-CM | POA: Diagnosis not present

## 2015-01-31 LAB — POCT URINALYSIS DIPSTICK
Bilirubin, UA: NEGATIVE
Glucose, UA: NEGATIVE
KETONES UA: NEGATIVE
NITRITE UA: NEGATIVE
Protein, UA: NEGATIVE
RBC UA: NEGATIVE
Urobilinogen, UA: 0.2
pH, UA: 6

## 2015-01-31 NOTE — Patient Instructions (Signed)
We will call you with results (around Wednesday). Please find a pharmacy in case you need antibiotics that we can call them into.   Drink lots of water- watch the cranberry juice due to sugar content (low sugar/sugar free is better).   Tylenol/Advil helpful until then.

## 2015-01-31 NOTE — Progress Notes (Signed)
Pre visit review using our clinic review tool, if applicable. No additional management support is needed unless otherwise documented below in the visit note. 

## 2015-01-31 NOTE — Progress Notes (Signed)
   Subjective:    Patient ID: Frank Perkins, male    DOB: 1960/07/31, 54 y.o.   MRN: [id number]  HPI  Frank Perkins is a 54 yo male with a CC of back pain on left lower side.   1) Flank pain- 6/7 in severity, on and off since last week  Back pain lower lumbar area, sat on couch x 2 days was at the beach.  Felt better by Friday and when coming home.  Drinking more water and cranberry juice.  5/6 today described as aching, reports history of back pain, but reports this feels different.   Stream not as strong, darker yellow and stream became stronger coming home from the beach. Taking Flomax daily he reports.   Advil at the beach was helpful. (will be at beach for 1 week starting tomorrow will be in [place]).   Review of Systems  Constitutional: Negative for fever, chills, diaphoresis and fatigue.  Genitourinary: Positive for decreased urine volume and difficulty urinating. Negative for dysuria, urgency, frequency, hematuria, flank pain, discharge, penile swelling, scrotal swelling, enuresis, penile pain and testicular pain.  Musculoskeletal: Positive for back pain.  Skin: Negative for rash.       Objective:   Physical Exam  Constitutional: He is oriented to person, place, and time. He appears well-developed and well-nourished. No distress.  BP 110/66 mmHg  Pulse 74  Temp(Src) 98 F (36.7 C)  Resp 18  Ht 5\' 11"  (1.803 m)  Wt 168 lb 9.6 oz (76.476 kg)  BMI 23.53 kg/m2  SpO2 95%   HENT:  Head: Normocephalic and atraumatic.  Right Ear: External ear normal.  Left Ear: External ear normal.  Abdominal: There is no CVA tenderness.  Neurological: He is alert and oriented to person, place, and time. He exhibits normal muscle tone. Coordination normal.  Skin: Skin is warm and dry. No rash noted. He is not diaphoretic.  Psychiatric: He has a normal mood and affect. His behavior is normal. Judgment and thought content normal.       Assessment & Plan:

## 2015-01-31 NOTE — Assessment & Plan Note (Signed)
Due to minimal UTI symptoms and low lumbar back discomfort will wait for urine culture. Pt reports OTC strips show possibility of infection hence why he came today. He reports advil was helpful. I asked him to continue this until we get results back. [place] [employer] [employer] [place] and will let us know a pharmacy when we call him with results. Asked him to drink water and watch sugary cranberry juice.

## 2015-02-03 ENCOUNTER — Other Ambulatory Visit: Payer: Self-pay | Admitting: Nurse Practitioner

## 2015-02-03 MED ORDER — CIPROFLOXACIN HCL 500 MG PO TABS: 500.0000 mg | ORAL_TABLET | Freq: Two times a day (BID) | ORAL | Status: DC

## 2015-02-04 LAB — URINE CULTURE: Colony Count: >=100000

## 2015-03-09 ENCOUNTER — Telehealth: Payer: Self-pay

## 2015-03-09 NOTE — Telephone Encounter (Signed)
Pt wife called inquiring about PA for cialis . Unable to reach pt at all numbers provided.

## 2015-03-10 NOTE — Telephone Encounter (Signed)
Spoke with pt in reference to cialis  PA. Made pt aware insurance denied coverage. Pt voiced frustration. Nurse advised pt to make a f/u appt with Carollee Herter to inquire about other medications. Pt had several insurance questions. Myself nor front office staff were able to answer pt questions. Advised pt to call insurance company for answers. Pt was unhappy with this answer and hung up.

## 2015-06-14 ENCOUNTER — Telehealth: Payer: Self-pay

## 2015-06-14 DIAGNOSIS — N529 Male erectile dysfunction, unspecified: Secondary | ICD-10-CM

## 2015-06-14 NOTE — Telephone Encounter (Signed)
Yes, wife stated anything will help.

## 2015-06-14 NOTE — Telephone Encounter (Signed)
Sildenafil 20 mg, 3 to 5 tablets, 2 hours prior to intercourse on an empty stomach #50 tablets.

## 2015-06-14 NOTE — Telephone Encounter (Signed)
Pt wife came into office inquiring about pt PA for cialis 20mg . Made wife aware insurance denied PA. Wife inquired about compounded medication. Please advise.

## 2015-06-14 NOTE — Telephone Encounter (Signed)
I am not aware of any compounded medications that would substitute for Cialis.  There is sildenafil.  Is that what she is talking about?

## 2015-06-15 MED ORDER — SILDENAFIL CITRATE 20 MG PO TABS: ORAL_TABLET | ORAL | Status: DC

## 2015-06-15 NOTE — Telephone Encounter (Signed)
LMOM that medication would be called into pharmacy. If any questions please call office.

## 2015-06-21 ENCOUNTER — Telehealth: Payer: Self-pay

## 2015-06-21 NOTE — Telephone Encounter (Signed)
Received a note from after hours triage stating pt called and medication was not at compounding pharmacy. Nurse contacted [facility] and script was in their system. Nurse called pt and made aware of medication and gave pt [facility]'s number. Pt voiced understanding.

## 2015-09-28 ENCOUNTER — Other Ambulatory Visit: Payer: Self-pay | Admitting: Urology

## 2015-09-28 DIAGNOSIS — N400 Enlarged prostate without lower urinary tract symptoms: Secondary | ICD-10-CM

## 2015-09-29 NOTE — Telephone Encounter (Signed)
Pt needs an office visit before anymore refills on tamsulosin can be refilled. 30 days with no refills were given today.

## 2016-02-06 ENCOUNTER — Ambulatory Visit: Payer: BLUE CROSS/BLUE SHIELD | Admitting: Urology

## 2016-02-09 ENCOUNTER — Ambulatory Visit: Payer: Self-pay | Admitting: Urology

## 2016-02-14 ENCOUNTER — Ambulatory Visit: Payer: Self-pay | Admitting: Urology

## 2016-02-14 ENCOUNTER — Ambulatory Visit (INDEPENDENT_AMBULATORY_CARE_PROVIDER_SITE_OTHER): Payer: BLUE CROSS/BLUE SHIELD | Admitting: Urology

## 2016-02-14 ENCOUNTER — Encounter: Payer: Self-pay | Admitting: Urology

## 2016-02-14 VITALS — BP 150/91 | HR 69 | Ht 71.0 in | Wt 170.0 lb

## 2016-02-14 DIAGNOSIS — M545 Low back pain, unspecified: Secondary | ICD-10-CM

## 2016-02-14 DIAGNOSIS — N529 Male erectile dysfunction, unspecified: Secondary | ICD-10-CM

## 2016-02-14 DIAGNOSIS — N400 Enlarged prostate without lower urinary tract symptoms: Secondary | ICD-10-CM

## 2016-02-14 MED ORDER — TADALAFIL 20 MG PO TABS: 20.0000 mg | ORAL_TABLET | Freq: Every day | ORAL | 5 refills | Status: DC | PRN

## 2016-02-14 NOTE — Progress Notes (Signed)
02/14/2016 8:47 AM   Armanda Heritage 05-Mar-1961 [id number]  Referring provider: Sherlene Shams, MD [address] [place], [place] [address]  Chief Complaint  Patient presents with  . Benign Prostatic Hypertrophy    1year    HPI: 55 year old male with a history of BPH with lower urinary tract symptoms and erectile dysfunction who returns today for annual follow-up.    BPH Currently on Flomax.  He ran out of medication in March and has possibly better since he rain out.   Today, he reports that urinary symptoms are fairly good right now.  He does have relatively good stream, gets up 1 times nightly.  He does have day time frequency but he is drinking plenty of fluids during the hot summer time.  He does feel that he is able to mostly empty his bladder but occasionally double voids a small amount the second time.  No UTIs or gross hematuria.    PSA drawn today.  + family history of prostate cancer (father).    Erectile dysfunction He has been using Cialis 20 mg as needed with excellent result.  He tried Viagra but didn't like the side effects.    Right lower back pain Episodes of right lower back pain at the beach this year.  Improved/ resolved with drinking lots of water and cranberry juice.  Pain did not move and slowly got better on its own.  He thought it may have been a kidney stones but no personal history of this and never saw stone pass.  No associated urinary symptoms, nausea, or vomiting.  PMH: Past Medical History:  Diagnosis Date  . Bladder outlet obstruction   . BPH (benign prostatic hyperplasia)   . ED (erectile dysfunction)   . GERD (gastroesophageal reflux disease)   . Prostatitis     Surgical History: Past Surgical History:  Procedure Laterality Date  . NASAL SINUS SURGERY      Home Medications:    Medication List       Accurate as of 02/14/16  8:47 AM. Always use your most recent med list.          antipyrine-benzocaine otic  solution Commonly known as:  AURALGAN Place 3-4 drops into the right ear every 2 (two) hours as needed for ear pain.   CIALIS 20 MG tablet Generic drug:  tadalafil   esomeprazole 20 MG capsule Commonly known as:  NEXIUM Take 20 mg by mouth daily at 12 noon.   multivitamin capsule Take 1 capsule by mouth daily.   tamsulosin 0.4 MG Caps capsule Commonly known as:  FLOMAX TAKE (1) CAPSULE BY MOUTH EVERY DAY       Allergies: No Known Allergies  Family History: Family History  Problem Relation Age of Onset  . Cancer Father 68    prostate Ca  . Bladder Cancer Neg Hx   . Kidney cancer Neg Hx     Social History:  reports that he has never smoked. His smokeless tobacco use includes Chew. He reports that he drinks about 0.5 oz of alcohol per week . He reports that he does not use drugs.  ROS: UROLOGY Frequent Urination?: No Hard to postpone urination?: No Burning/pain with urination?: No Get up at night to urinate?: No Leakage of urine?: No Urine stream starts and stops?: No Trouble starting stream?: No Do you have to strain to urinate?: No Blood in urine?: No Urinary tract infection?: No Sexually transmitted disease?: No Injury to kidneys or bladder?: No  Painful intercourse?: No Weak stream?: No Erection problems?: No Penile pain?: No  Gastrointestinal Nausea?: No Vomiting?: No Indigestion/heartburn?: No Diarrhea?: No Constipation?: No  Constitutional Fever: No Night sweats?: No Weight loss?: No Fatigue?: No  Skin Skin rash/lesions?: No Itching?: No  Eyes Blurred vision?: No Double vision?: No  Ears/Nose/Throat Sore throat?: No Sinus problems?: No  Hematologic/Lymphatic Swollen glands?: No Easy bruising?: No  Cardiovascular Leg swelling?: No Chest pain?: No  Respiratory Cough?: No Shortness of breath?: No  Endocrine Excessive thirst?: No  Musculoskeletal Back pain?: No Joint pain?: No  Neurological Headaches?: No Dizziness?:  No  Psychologic Depression?: No Anxiety?: No  Physical Exam: BP (!) 150/91   Pulse 69   Ht 5\' 11"  (1.803 m)   Wt 170 lb (77.1 kg)   BMI 23.71 kg/m   Constitutional:  Alert and oriented, No acute distress. HEENT: [place] AT, moist mucus membranes.  Trachea midline, no masses. Cardiovascular: No clubbing, cyanosis, or edema. Respiratory: Normal respiratory effort, no increased work of breathing. GI: Abdomen is soft, nontender, nondistended, no abdominal masses GU: No CVA tenderness.  Rectal exam: 50+ cc prostate, no nodules, nontender Skin: No rashes, bruises or suspicious lesions. Neurologic: Grossly intact, no focal deficits, moving all 4 extremities. Psychiatric: Normal mood and affect.  Laboratory Data: Lab Results  Component Value Date   WBC 4.3 (L) 10/24/2012   HGB 14.9 10/24/2012   HCT 42.8 10/24/2012   MCV 94.0 10/24/2012   PLT 218.0 10/24/2012    Lab Results  Component Value Date   CREATININE 0.8 10/24/2012     PSA 1.8 ng/dL on 17/61/6073  Testosterone 359 ng/dL on 71/12/2692   Assessment & Plan:    1. BPH (benign prostatic hyperplasia) Symptoms stable off flomax, minimal symptoms Recommend holding flomax if not appreciating any benefit PSA/ DRE screening today, will call with PSA results Prostate enlarged on exam otday - PSA  2. Erectile dysfunction, unspecified erectile dysfunction type Continue Cialis 20 mg prn  3. Right low back pain Mostly likely MSK based on location and nature Currently asymptomatic  Return if symptoms worsen or fail to improve.  Vanna Scotland, MD  [facility] [address] [place], [place] [address] [phone]

## 2016-02-15 ENCOUNTER — Telehealth: Payer: Self-pay

## 2016-02-15 LAB — PSA: PROSTATE SPECIFIC AG, SERUM: 2.1 ng/mL (ref 0.0–4.0)

## 2016-02-15 NOTE — Telephone Encounter (Signed)
-----   Message from Vanna Scotland, MD sent at 02/15/2016  7:30 AM EDT ----- Please let patient know his PSA is stable.    Vanna Scotland, MD

## 2016-02-15 NOTE — Telephone Encounter (Signed)
Spoke with pt in reference to lab results. Pt voiced understanding.  

## 2017-04-26 ENCOUNTER — Telehealth: Payer: Self-pay

## 2017-04-26 DIAGNOSIS — N401 Enlarged prostate with lower urinary tract symptoms: Secondary | ICD-10-CM

## 2017-04-26 NOTE — Telephone Encounter (Signed)
Pt wife called stating pt is over due for his yearly PSA check up. Lab appt and OV appts made. Orders placed.

## 2017-05-14 ENCOUNTER — Other Ambulatory Visit: Payer: Self-pay

## 2017-05-14 DIAGNOSIS — N401 Enlarged prostate with lower urinary tract symptoms: Secondary | ICD-10-CM

## 2017-05-15 LAB — PSA: PROSTATE SPECIFIC AG, SERUM: 1.9 ng/mL (ref 0.0–4.0)

## 2017-05-17 ENCOUNTER — Ambulatory Visit: Payer: Self-pay | Admitting: Urology

## 2017-06-11 ENCOUNTER — Ambulatory Visit: Payer: Self-pay | Admitting: Urology

## 2017-06-20 ENCOUNTER — Ambulatory Visit: Payer: Self-pay | Admitting: Urology

## 2017-06-25 NOTE — Progress Notes (Signed)
06/26/2017 3:44 PM   Frank Perkins July 24, 1960 [id number]  Referring provider: Sherlene Shams, MD [address] [place], [place] [address]  Chief Complaint  Patient presents with  . Benign Prostatic Hypertrophy    HPI: 56 year old Caucasian male with a history of BPH with lower urinary tract symptoms and erectile dysfunction who returns today for annual follow-up.    BPH with LUTS I PSS score 15/3.  Currently not taking Flomax.    Today, he reports frequency and intermittency.  He does have relatively good stream, gets up 1 times nightly.  He does have day time frequency but he is attributes this to nerves.  He does feel that he is able to mostly empty his bladder but occasionally double voids a small amount the second time.  No UTIs or gross hematuria.    + family history of prostate cancer (father).    IPSS    Row Name 06/26/17 1500         International Prostate Symptom Score   How often have you had the sensation of not emptying your bladder?  Less than half the time     How often have you had to urinate less than every two hours?  Less than 1 in 5 times     How often have you found you stopped and started again several times when you urinated?  More than half the time     How often have you found it difficult to postpone urination?  Less than 1 in 5 times     How often have you had a weak urinary stream?  About half the time     How often have you had to strain to start urination?  About half the time     How many times did you typically get up at night to urinate?  1 Time     Total IPSS Score  15       Quality of Life due to urinary symptoms   If you were to spend the rest of your life with your urinary condition just the way it is now how would you feel about that?  Mixed        Score:  1-7 Mild 8-19 Moderate 20-35 Severe  Erectile dysfunction SHIM score is 21.  He has been using Cialis 20 mg as needed with excellent result.  He tried Viagra  but didn't like the side effects.  He would like to try the sildenafil as it is a lower dose.     PMH: Past Medical History:  Diagnosis Date  . Bladder outlet obstruction   . BPH (benign prostatic hyperplasia)   . ED (erectile dysfunction)   . GERD (gastroesophageal reflux disease)   . Prostatitis     Surgical History: Past Surgical History:  Procedure Laterality Date  . NASAL SINUS SURGERY      Home Medications:  Allergies as of 06/26/2017   No Known Allergies     Medication List        Accurate as of 06/26/17  3:44 PM. Always use your most recent med list.          esomeprazole 20 MG capsule Commonly known as:  NEXIUM Take 20 mg by mouth daily at 12 noon.   sildenafil 20 MG tablet Commonly known as:  REVATIO Take 3 to 5 tablets two hours before intercouse on an empty stomach.  Do not take with nitrates.   tadalafil 20 MG tablet Commonly known  as:  CIALIS Take 1 tablet (20 mg total) by mouth daily as needed for erectile dysfunction.   tamsulosin 0.4 MG Caps capsule Commonly known as:  FLOMAX Take 1 capsule (0.4 mg total) by mouth daily.       Allergies: No Known Allergies  Family History: Family History  Problem Relation Age of Onset  . Cancer Father [id number]       prostate Ca  . Prostate cancer Father   . Healthy Mother   . Healthy Sister   . Healthy Brother   . Bladder Cancer Neg Hx   . Kidney cancer Neg Hx   . Kidney disease Neg Hx   . Sickle cell trait Neg Hx   . Tuberculosis Neg Hx     Social History:  reports that  has never smoked. His smokeless tobacco use includes chew. He reports that he drinks alcohol. He reports that he does not use drugs.  ROS: UROLOGY Frequent Urination?: Yes Hard to postpone urination?: No Burning/pain with urination?: No Get up at night to urinate?: No Leakage of urine?: No Urine stream starts and stops?: Yes Trouble starting stream?: No Do you have to strain to urinate?: No Blood in urine?: No Urinary tract  infection?: No Sexually transmitted disease?: No Injury to kidneys or bladder?: No Painful intercourse?: No Weak stream?: No Erection problems?: Yes Penile pain?: No  Gastrointestinal Nausea?: No Vomiting?: No Indigestion/heartburn?: No Diarrhea?: No Constipation?: No  Constitutional Fever: No Night sweats?: No Weight loss?: No Fatigue?: No  Skin Skin rash/lesions?: No Itching?: No  Eyes Blurred vision?: No Double vision?: No  Ears/Nose/Throat Sore throat?: No Sinus problems?: Yes  Hematologic/Lymphatic Swollen glands?: No Easy bruising?: No  Cardiovascular Leg swelling?: No Chest pain?: No  Respiratory Cough?: No Shortness of breath?: No  Endocrine Excessive thirst?: No  Musculoskeletal Back pain?: No Joint pain?: No  Neurological Headaches?: No Dizziness?: No  Psychologic Depression?: No Anxiety?: No  Physical Exam: BP 138/70   Pulse 84   Ht 5\' 11"  (1.803 m)   Wt 169 lb (76.7 kg)   BMI 23.57 kg/m   Constitutional:  Alert and oriented, No acute distress. HEENT:  AT, moist mucus membranes.  Trachea midline, no masses. Cardiovascular: No clubbing, cyanosis, or edema. Respiratory: Normal respiratory effort, no increased work of breathing. GI: Abdomen is soft, nontender, nondistended, no abdominal masses GU: No CVA tenderness.  Rectal exam: 50+ cc prostate, no nodules, nontender Skin: No rashes, bruises or suspicious lesions. Neurologic: Grossly intact, no focal deficits, moving all 4 extremities. Psychiatric: Normal mood and affect.  Laboratory Data: PSA History  1.5 in 04/2010  1.5 in 05/2012  2.22 in 10/2012  1.8 in 09/2014  2.1 in 01/2016  1.9 in 04/2017  I have reviewed the labs.  Assessment & Plan:    1. BPH with LUTS  - IPSS score is 15/3  - Continue conservative management, avoiding bladder irritants and timed voiding's  - most bothersome symptoms is/are frequency  - may restart tamsulosin 0.4 mg daily, script given  to pharmacy  - RTC in 12 months for IPSS, PSA and exam   2. Erectile dysfunction  - SHIM score is 21  - sent a script for sildenafil 20 mg, 3 to 5 tablets two hours prior to intercourse on an empty stomach, # 50; he is warned not to take medications that contain nitrates.  I also advised him of the side effects, such as: headache, flushing, dyspepsia, abnormal vision, nasal congestion, back pain, myalgia, nausea,  dizziness, and rash.  - RTC in 12 months for repeat SHIM score and exam    Return in about 1 year (around 06/26/2018) for IPSS, SHIM, PSA and exam.  Michiel CowboySHANNON Lanyiah Brix, [facility]  [facility] [address], [address] [place], [place] [address] [phone]

## 2017-06-26 ENCOUNTER — Ambulatory Visit (INDEPENDENT_AMBULATORY_CARE_PROVIDER_SITE_OTHER): Payer: Self-pay | Admitting: Urology

## 2017-06-26 ENCOUNTER — Encounter: Payer: Self-pay | Admitting: Urology

## 2017-06-26 VITALS — BP 138/70 | HR 84 | Ht 71.0 in | Wt 169.0 lb

## 2017-06-26 DIAGNOSIS — N529 Male erectile dysfunction, unspecified: Secondary | ICD-10-CM

## 2017-06-26 DIAGNOSIS — N138 Other obstructive and reflux uropathy: Secondary | ICD-10-CM

## 2017-06-26 DIAGNOSIS — N401 Enlarged prostate with lower urinary tract symptoms: Secondary | ICD-10-CM

## 2017-06-26 MED ORDER — TAMSULOSIN HCL 0.4 MG PO CAPS: 0.4000 mg | ORAL_CAPSULE | Freq: Every day | ORAL | 3 refills | Status: DC

## 2017-06-26 MED ORDER — SILDENAFIL CITRATE 20 MG PO TABS: ORAL_TABLET | ORAL | 3 refills | Status: DC

## 2017-07-18 ENCOUNTER — Ambulatory Visit: Payer: Self-pay

## 2017-07-18 NOTE — Telephone Encounter (Signed)
  Reason for Disposition . [1] Continuous (nonstop) coughing interferes with work or school AND [2] no improvement using cough treatment per Care Advice  Answer Assessment - Initial Assessment Questions 1. ONSET: "When did the cough begin?"      Last night 2. SEVERITY: "How bad is the cough today?"      Frequent cough 'seems to be coughing all the time" 3. RESPIRATORY DISTRESS: "Describe your breathing."      Breathing is fine per pt 4. FEVER: "Do you have a fever?" If so, ask: "What is your temperature, how was it measured, and when did it start?"     'feels achy all over. No documented fever 5. SPUTUM: "Describe the color of your sputum" (clear, white, yellow, green)     yello 6. HEMOPTYSIS: "Are you coughing up any blood?" If so ask: "How much?" (flecks, streaks, tablespoons, etc.)     no 7. CARDIAC HISTORY: "Do you have any history of heart disease?" (e.g., heart attack, congestive heart failure)      no 8. LUNG HISTORY: "Do you have any history of lung disease?"  (e.g., pulmonary embolus, asthma, emphysema)     no 9. PE RISK FACTORS: "Do you have a history of blood clots?" (or: recent major surgery, recent prolonged travel, bedridden )     no 10. OTHER SYMPTOMS: "Do you have any other symptoms?" (e.g., runny nose, wheezing, chest pain)       Post nasal drip, no chest pain, no wheezing 11. PREGNANCY: "Is there any chance you are pregnant?" "When was your last menstrual period?"       n/a 12. TRAVEL: "Have you traveled out of the country in the last month?" (e.g., travel history, exposures)       no  Protocols used: COUGH - ACUTE PRODUCTIVE-A-AH

## 2017-07-18 NOTE — Telephone Encounter (Addendum)
Pt not home when wife called. Pt's wife trudy states she will call back when available Pt called back and having body aches, productive cough with yellow phlegm, post nasal drip. Pt wanting to see PCP but no openings at either [place] location. [facility] for 3:45 with Dr Posey ReaPlotnikov.

## 2017-07-19 ENCOUNTER — Encounter: Payer: Self-pay | Admitting: Internal Medicine

## 2017-07-19 ENCOUNTER — Ambulatory Visit (INDEPENDENT_AMBULATORY_CARE_PROVIDER_SITE_OTHER): Payer: Self-pay | Admitting: Internal Medicine

## 2017-07-19 VITALS — BP 116/78 | HR 77 | Temp 99.8°F | Ht 71.0 in | Wt 171.0 lb

## 2017-07-19 DIAGNOSIS — R079 Chest pain, unspecified: Secondary | ICD-10-CM

## 2017-07-19 DIAGNOSIS — R52 Pain, unspecified: Secondary | ICD-10-CM

## 2017-07-19 DIAGNOSIS — R059 Cough, unspecified: Secondary | ICD-10-CM

## 2017-07-19 DIAGNOSIS — J0101 Acute recurrent maxillary sinusitis: Secondary | ICD-10-CM

## 2017-07-19 DIAGNOSIS — R05 Cough: Secondary | ICD-10-CM

## 2017-07-19 DIAGNOSIS — J101 Influenza due to other identified influenza virus with other respiratory manifestations: Secondary | ICD-10-CM

## 2017-07-19 LAB — POC INFLUENZA A&B (BINAX/QUICKVUE)
Influenza A, POC: POSITIVE — AB
Influenza B, POC: NEGATIVE

## 2017-07-19 MED ORDER — CEFDINIR 300 MG PO CAPS: 300.0000 mg | ORAL_CAPSULE | Freq: Two times a day (BID) | ORAL | 0 refills | Status: DC

## 2017-07-19 MED ORDER — OSELTAMIVIR PHOSPHATE 75 MG PO CAPS: 75.0000 mg | ORAL_CAPSULE | Freq: Two times a day (BID) | ORAL | 0 refills | Status: DC

## 2017-07-19 MED ORDER — PROMETHAZINE-CODEINE 6.25-10 MG/5ML PO SYRP: 5.0000 mL | ORAL_SOLUTION | ORAL | 0 refills | Status: DC | PRN

## 2017-07-19 NOTE — Assessment & Plan Note (Signed)
Tamiflu Prom-cod syr Rx

## 2017-07-19 NOTE — Assessment & Plan Note (Signed)
12/18 Cefdinir PO

## 2017-07-19 NOTE — Progress Notes (Signed)
Subjective:  Patient ID: Frank Perkins, male    DOB: [birth year]  Age: 56 y.o. MRN: [id number]  CC: No chief complaint on file.   HPI Frank Perkins presents for a flu like sx's x 1 week Sinus d/c is green now   Outpatient Medications Prior to Visit  Medication Sig Dispense Refill  . esomeprazole (NEXIUM) 20 MG capsule Take 20 mg by mouth daily at 12 noon.    . sildenafil (REVATIO) 20 MG tablet Take 3 to 5 tablets two hours before intercouse on an empty stomach.  Do not take with nitrates. 50 tablet 3  . tamsulosin (FLOMAX) 0.4 MG CAPS capsule Take 1 capsule (0.4 mg total) by mouth daily. 90 capsule 3  . tadalafil (CIALIS) 20 MG tablet Take 1 tablet (20 mg total) by mouth daily as needed for erectile dysfunction. (Patient not taking: Reported on 06/26/2017) 6 tablet 5   No facility-administered medications prior to visit.     ROS Review of Systems  Constitutional: Positive for chills, fatigue and fever. Negative for appetite change and unexpected weight change.  HENT: Positive for congestion, postnasal drip, rhinorrhea, sinus pain and sore throat. Negative for nosebleeds, sneezing and trouble swallowing.   Eyes: Negative for itching and visual disturbance.  Respiratory: Positive for cough and chest tightness.   Cardiovascular: Positive for chest pain. Negative for palpitations and leg swelling.  Gastrointestinal: Negative for abdominal distention, blood in stool, diarrhea and nausea.  Genitourinary: Negative for frequency and hematuria.  Musculoskeletal: Positive for arthralgias. Negative for back pain, gait problem, joint swelling and neck pain.  Skin: Negative for rash.  Neurological: Positive for weakness. Negative for dizziness, tremors and speech difficulty.  Psychiatric/Behavioral: Negative for agitation, dysphoric mood and sleep disturbance. The patient is not nervous/anxious.     Objective:  BP 116/78 (BP Location: Left Arm, Patient Position: Sitting, Cuff Size:  Large)   Pulse 77   Temp 99.8 F (37.7 C) (Oral)   Ht 5\' 11"  (1.803 m)   Wt 171 lb (77.6 kg)   SpO2 98%   BMI 23.85 kg/m   BP Readings from Last 3 Encounters:  07/19/17 116/78  06/26/17 138/70  02/14/16 (!) 150/91    Wt Readings from Last 3 Encounters:  07/19/17 171 lb (77.6 kg)  06/26/17 169 lb (76.7 kg)  02/14/16 170 lb (77.1 kg)    Physical Exam  Constitutional: He is oriented to person, place, and time. He appears well-developed. No distress.  NAD  HENT:  Mouth/Throat: Oropharyngeal exudate present.  Eyes: Conjunctivae are normal. Pupils are equal, round, and reactive to light.  Neck: Normal range of motion. No JVD present. No thyromegaly present.  Cardiovascular: Normal rate, regular rhythm, normal heart sounds and intact distal pulses. Exam reveals no gallop and no friction rub.  No murmur heard. Pulmonary/Chest: Effort normal and breath sounds normal. No respiratory distress. He has no wheezes. He has no rales. He exhibits no tenderness.  Abdominal: Soft. Bowel sounds are normal. He exhibits no distension and no mass. There is no tenderness. There is no rebound and no guarding.  Musculoskeletal: Normal range of motion. He exhibits no edema or tenderness.  Lymphadenopathy:    He has no cervical adenopathy.  Neurological: He is alert and oriented to person, place, and time. He has normal reflexes. No cranial nerve deficit. He exhibits normal muscle tone. He displays a negative Romberg sign. Coordination and gait normal.  Skin: Skin is warm and dry. No rash noted.  Psychiatric: He  has a normal mood and affect. His behavior is normal. Judgment and thought content normal.  looks tired eryth throat  Lab Results  Component Value Date   WBC 4.3 (L) 10/24/2012   HGB 14.9 10/24/2012   HCT 42.8 10/24/2012   PLT 218.0 10/24/2012   GLUCOSE 97 10/24/2012   LDLDIRECT 67.4 10/24/2012   ALT 37 10/24/2012   AST 29 10/24/2012   NA 136 10/24/2012   K 4.2 10/24/2012   CL 105  10/24/2012   CREATININE 0.8 10/24/2012   BUN 14 10/24/2012   CO2 25 10/24/2012   TSH 1.320 10/24/2012   PSA 2.22 10/27/2012    Dg Chest 2 View    Comparison is made to the study of 06/29/2009. The lungs are clear. The heart and pulmonary vessels are normal. The bony and mediastinal structures are unremarkable. There is no effusion. There is no pneumothorax or evidence of congestive failure. IMPRESSION:      No acute cardiopulmonary disease.     Assessment & Plan:   Diagnoses and all orders for this visit:  Cough -     POC Influenza A&B (Binax test)  Body aches -     POC Influenza A&B (Binax test)   I have discontinued Frank Perkins's tadalafil. I am also having him maintain his esomeprazole, tamsulosin, and sildenafil.  No orders of the defined types were placed in this encounter.    Follow-up: No Follow-up on file.  Sonda PrimesAlex Lorrayne Ismael, MD

## 2017-07-19 NOTE — Patient Instructions (Signed)
You can use over-the-counter  "cold" medicines  such as "Tylenol cold" , "Advil cold",  "Mucinex" or" Mucinex D"  for cough and congestion.   Avoid decongestants if you have high blood pressure and use "Afrin" nasal spray for nasal congestion as directed. Use " Delsym" or" Robitussin" cough syrup varietis for cough.  You can use plain "Tylenol" or "Advil" for fever, chills and achyness. Use Halls or Ricola cough drops.   Please, make an appointment if you are not better or if you're worse.  

## 2017-07-19 NOTE — Assessment & Plan Note (Signed)
Due to cough Prom-cod syr

## 2017-07-26 ENCOUNTER — Other Ambulatory Visit: Payer: Self-pay

## 2017-07-26 ENCOUNTER — Ambulatory Visit (INDEPENDENT_AMBULATORY_CARE_PROVIDER_SITE_OTHER): Payer: Self-pay

## 2017-07-26 ENCOUNTER — Encounter: Payer: Self-pay | Admitting: Physician Assistant

## 2017-07-26 ENCOUNTER — Ambulatory Visit: Payer: Self-pay | Admitting: Physician Assistant

## 2017-07-26 VITALS — BP 122/74 | HR 88 | Temp 98.0°F | Resp 16 | Ht 71.0 in | Wt 167.6 lb

## 2017-07-26 DIAGNOSIS — R059 Cough, unspecified: Secondary | ICD-10-CM

## 2017-07-26 DIAGNOSIS — R05 Cough: Secondary | ICD-10-CM

## 2017-07-26 MED ORDER — PREDNISONE 20 MG PO TABS: ORAL_TABLET | ORAL | 0 refills | Status: AC

## 2017-07-26 NOTE — Patient Instructions (Signed)
     IF you received an x-ray today, you will receive an invoice from [facility]. Please contact [facility] at [phone] with questions or concerns regarding your invoice.   IF you received labwork today, you will receive an invoice from [facility]. Please contact [facility] at [phone] with questions or concerns regarding your invoice.   Our billing staff will not be able to assist you with questions regarding bills from these companies.  You will be contacted with the lab results as soon as they are available. The fastest way to get your results is to activate your My Chart account. Instructions are located on the last page of this paperwork. If you have not heard from us regarding the results in 2 weeks, please contact this office.     

## 2017-07-26 NOTE — Progress Notes (Signed)
    07/26/2017 1:02 PM   DOB: Apr 16, 1961 / MRN: [id number]  SUBJECTIVE:  Frank Perkins is a 57 y.o. male presenting for double sickening screening positive over a week ago.  He completed tamiflu and is taking omnicef as prescribed from Dr. Avel SensorPlonikov.   He has No Known Allergies.   He  has a past medical history of Bladder outlet obstruction, BPH (benign prostatic hyperplasia), ED (erectile dysfunction), GERD (gastroesophageal reflux disease), and Prostatitis.    He  reports that  has never smoked. His smokeless tobacco use includes chew. He reports that he drinks alcohol. He reports that he does not use drugs. He  has no sexual activity history on file. The patient  has a past surgical history that includes Nasal sinus surgery.  His family history includes Cancer (age of onset: 765) in his father; Healthy in his brother, mother, and sister; Prostate cancer in his father.  Review of Systems  Constitutional: Negative for chills, diaphoresis and fever.  Respiratory: Positive for cough, sputum production and shortness of breath. Negative for hemoptysis and wheezing.   Cardiovascular: Negative for chest pain, orthopnea and leg swelling.  Gastrointestinal: Negative for nausea.  Skin: Negative for rash.  Neurological: Negative for dizziness.    The problem list and medications were reviewed and updated by myself where necessary and exist elsewhere in the encounter.   OBJECTIVE:  BP 122/74   Pulse 88   Temp 98 F (36.7 C)   Resp 16   Ht 5\' 11"  (1.803 m)   Wt 167 lb 9.6 oz (76 kg)   SpO2 94%   BMI 23.38 kg/m   Physical Exam  Constitutional: He is oriented to person, place, and time. He appears well-developed. He is active and cooperative.  Non-toxic appearance.  Cardiovascular: Normal rate, regular rhythm, S1 normal, S2 normal, normal heart sounds, intact distal pulses and normal pulses. Exam reveals no gallop and no friction rub.  No murmur heard. Pulmonary/Chest: Effort normal. No  stridor. No tachypnea. No respiratory distress. He has no wheezes. He has no rales. He exhibits no tenderness.  Abdominal: He exhibits no distension.  Musculoskeletal: He exhibits no edema or tenderness.  Neurological: He is alert and oriented to person, place, and time.  Skin: Skin is warm and dry. He is not diaphoretic. No pallor.  Vitals reviewed.    No results found for this or any previous visit (from the past 72 hour(s)).  No results found.  ASSESSMENT AND PLAN:  Frank Perkins was seen today for cough.  Diagnoses and all orders for this visit:  Cough: Sats mildly decreased and pulse mildly elevated realative to his normal. He has been compliant with omnicef.  Chest rads are negative.  Will start prednisone for post viral cough.  He will follow up with his primary.  -     DG Chest 2 View; Future    The patient is advised to call or return to clinic if he does not see an improvement in symptoms, or to seek the care of the closest emergency department if he worsens with the above plan.   Frank Perkins, MHS, PA-C Primary Care at [facility] 07/26/2017 1:02 PM

## 2017-07-29 ENCOUNTER — Ambulatory Visit: Payer: Self-pay | Admitting: Internal Medicine

## 2017-08-01 ENCOUNTER — Telehealth: Payer: Self-pay | Admitting: Physician Assistant

## 2017-08-01 NOTE — Telephone Encounter (Signed)
Copied from CRM [phone]. Topic: Quick Communication - See Telephone Encounter >> Aug 01, 2017  4:19 PM Guinevere FerrariMorris, Epifanio Labrador E, NT wrote: CRM for notification. See Telephone encounter for: Patient wife called in and said that he was seen [facility] few days ago and patient is still not feeling any better and still having a lot of drainage and cough. Pt wanted to see if something can be called in. Pt uses [id number] - [place],  - [address]  08/01/17.

## 2017-08-02 NOTE — Telephone Encounter (Signed)
Advised patient he would need to be seen if he is not getting better.    Patient voiced understanding

## 2017-09-24 ENCOUNTER — Telehealth: Payer: Self-pay | Admitting: *Deleted

## 2017-09-24 ENCOUNTER — Ambulatory Visit: Payer: Self-pay

## 2017-09-24 NOTE — Telephone Encounter (Signed)
Copied from CRM [phone]. Topic: Appointment Scheduling - Scheduling Inquiry for Clinic >> Sep 24, 2017 10:54 AM Crist InfanteHarrald, Kathy J wrote: Reason for CRM: wife called back to reschedule pt[facility] app[facility] ([place] [facility] [place]) She would like to request if pt can come in earlier, pt needs to get this done asap. First available wed, 3/13. Please call if pt can come in sooner. Pt getting a tb test, so will need to allow the time to come back and have read.  >> Sep 24, 2017 12:23 PM Bare, Hale DroneKristen C, CMA wrote: Patient last seen in 2016 by Lyla Sonarrie

## 2017-09-24 NOTE — Telephone Encounter (Signed)
This patient has not been seen in this office since 2016, last appointment was with Dr. Darrick Huntsmanullo and patient no showed, PCP says none I have to have a physician to sign for Immunization please and receive result of TB test, please advise  Patient wife says Dr. Darrick Huntsmanullo is PCP.

## 2017-09-24 NOTE — Telephone Encounter (Signed)
Patient notified and voiced understanding , patient must be established with a provider before he can be scheduled .

## 2017-09-24 NOTE — Telephone Encounter (Signed)
He is no longer my patient and I am not accepting new patients at this time . He has seen [facility] in December so he needs to ask them to manage the PPD

## 2017-10-02 ENCOUNTER — Ambulatory Visit: Payer: Self-pay

## 2018-02-10 ENCOUNTER — Encounter (INDEPENDENT_AMBULATORY_CARE_PROVIDER_SITE_OTHER): Payer: Self-pay

## 2018-02-10 ENCOUNTER — Ambulatory Visit (INDEPENDENT_AMBULATORY_CARE_PROVIDER_SITE_OTHER): Payer: BLUE CROSS/BLUE SHIELD | Admitting: Internal Medicine

## 2018-02-10 ENCOUNTER — Encounter: Payer: Self-pay | Admitting: Internal Medicine

## 2018-02-10 VITALS — BP 132/84 | HR 82 | Temp 98.3°F | Resp 15 | Ht 71.0 in | Wt 175.4 lb

## 2018-02-10 DIAGNOSIS — E038 Other specified hypothyroidism: Secondary | ICD-10-CM | POA: Diagnosis not present

## 2018-02-10 DIAGNOSIS — Z8619 Personal history of other infectious and parasitic diseases: Secondary | ICD-10-CM

## 2018-02-10 DIAGNOSIS — R030 Elevated blood-pressure reading, without diagnosis of hypertension: Secondary | ICD-10-CM

## 2018-02-10 DIAGNOSIS — R252 Cramp and spasm: Secondary | ICD-10-CM

## 2018-02-10 DIAGNOSIS — G4762 Sleep related leg cramps: Secondary | ICD-10-CM | POA: Insufficient documentation

## 2018-02-10 DIAGNOSIS — A770 Spotted fever due to Rickettsia rickettsii: Secondary | ICD-10-CM

## 2018-02-10 DIAGNOSIS — R0600 Dyspnea, unspecified: Secondary | ICD-10-CM | POA: Diagnosis not present

## 2018-02-10 DIAGNOSIS — Z789 Other specified health status: Secondary | ICD-10-CM | POA: Diagnosis not present

## 2018-02-10 DIAGNOSIS — Z7289 Other problems related to lifestyle: Secondary | ICD-10-CM

## 2018-02-10 NOTE — Assessment & Plan Note (Signed)
Diagnosed by [facility] in May and treated with doxycycline bid x 14 days,  antibody titers ordered.

## 2018-02-10 NOTE — Assessment & Plan Note (Signed)
Advised to reduce use to 2 drinks  per night

## 2018-02-10 NOTE — Progress Notes (Signed)
Subjective:  Patient ID: Frank Perkins, male    DOB: 1961-03-15  Age: 57 y.o. MRN: [id number]  CC: The primary encounter diagnosis was TSH (thyroid-stimulating hormone deficiency). Diagnoses of Muscle cramps, [facility], Elevated blood pressure reading in office without diagnosis of hypertension, History of [facility] fever, Dyspnea, unspecified type, Alcohol use, and Nocturnal leg cramps were also pertinent to this visit.  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    finished 2 weeks of twice daily doxycycline, finally started to feel better after 10 days of therapy.    Still having  Chest tightness and ild dyspnea with out door activity .  Feels short of breath in a recliner .  No leg swelling  .  Does not exercise.  No smoking .  Took a 5.5 hour car ride (one way) in April/may)   Dips snuff Averages > 2 drinks per night    Rash on ankle and  stomach   Treated for flu l Jan 2019  [facility] for [facility] wants to transfer to me . Oct 24 or later   Had colonoscopy in 2008 for rectal bleeding     History Frank Perkins has a past medical history of Bladder outlet obstruction, BPH (benign prostatic hyperplasia), ED (erectile dysfunction), GERD (gastroesophageal reflux disease), and Prostatitis.   He has a past surgical history that includes Nasal sinus surgery.   His family history includes Cancer (age of onset: 1965) in his father; Dementia in his father; Esophageal cancer in his father; Healthy in his brother, mother, and sister; Prostate cancer in his father.He reports that he has never smoked. His smokeless tobacco use includes chew. He reports that he drinks alcohol. He reports that he does not use drugs.  Outpatient Medications Prior to Visit  Medication Sig Dispense Refill  . esomeprazole (NEXIUM) 20 MG capsule Take 20 mg by mouth daily at 12 noon.    . sildenafil  (REVATIO) 20 MG tablet Take 3 to 5 tablets two hours before intercouse on an empty stomach.  Do not take with nitrates. 50 tablet 3  . tamsulosin (FLOMAX) 0.4 MG CAPS capsule Take 1 capsule (0.4 mg total) by mouth daily. 90 capsule 3  . cefdinir (OMNICEF) 300 MG capsule Take 1 capsule (300 mg total) by mouth 2 (two) times daily. (Patient not taking: Reported on 02/10/2018) 20 capsule 0  . oseltamivir (TAMIFLU) 75 MG capsule Take 1 capsule (75 mg total) by mouth 2 (two) times daily. (Patient not taking: Reported on 07/26/2017) 10 capsule 0  . promethazine-codeine (PHENERGAN WITH CODEINE) 6.25-10 MG/5ML syrup Take 5 mLs by mouth every 4 (four) hours as needed. (Patient not taking: Reported on 07/26/2017) 300 mL 0   No facility-administered medications prior to visit.     Review of Systems:  Patient denies headache, fevers, malaise, unintentional weight loss, skin rash, eye pain, sinus congestion and sinus pain, sore throat, dysphagia,  hemoptysis , cough, dyspnea, wheezing, chest pain, palpitations, orthopnea, edema, abdominal pain, nausea, melena, diarrhea, constipation, flank pain, dysuria, hematuria, urinary  Frequency, nocturia, numbness, tingling, seizures,  Focal weakness, Loss of consciousness,  Tremor, insomnia, depression, anxiety, and suicidal ideation.     Objective:  BP 132/84 (BP Location: Left Arm, Patient Position: Sitting, Cuff Size: Normal)   Pulse 82   Temp 98.3 F (36.8 C) (Oral)   Resp 15   Ht 5'  11" (1.803 m)   Wt 175 lb 6.4 oz (79.6 kg)   SpO2 95%   BMI 24.46 kg/m   Physical Exam:  General appearance: alert, cooperative and appears stated age Ears: normal TM's and external ear canals both ears Throat: lips, mucosa, and tongue normal; teeth and gums normal Neck: no adenopathy, no carotid bruit, supple, symmetrical, trachea midline and thyroid not enlarged, symmetric, no tenderness/mass/nodules Back: symmetric, no curvature. ROM normal. No CVA tenderness. Lungs: clear to  auscultation bilaterally Heart: regular rate and rhythm, S1, S2 normal, no murmur, click, rub or gallop Abdomen: soft, non-tender; bowel sounds normal; no masses,  no organomegaly Pulses: 2+ and symmetric Skin: Skin color, texture, turgor normal. No rashes or lesions Lymph nodes: Cervical, supraclavicular, and axillary nodes normal.   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       Dyspnea    Subjective report of chest tightness ever since his RMSF episode .  He has deferred workup with chest x ray and EKG until he returns for CPE in October.       Alcohol use    Advised to reduce use to 2 drinks  per night        Other Visit Diagnoses    TSH (thyroid-stimulating hormone deficiency)    -  Primary   Muscle cramps       Relevant Orders   Comprehensive metabolic panel   Magnesium   TSH   [place] spotted fever       Relevant Orders   Rocky mtn spotted fvr abs pnl(IgG+IgM)   Elevated blood pressure reading in office without diagnosis of hypertension       Relevant Orders   Microalbumin / creatinine urine ratio     A total of 25 minutes of face to face time was spent with patient more than half of which was spent in counselling about the above mentioned conditions  and coordination of care  I have discontinued Frank Stallion A. Cramer's oseltamivir, cefdinir, and promethazine-codeine. I am also having him maintain his esomeprazole, tamsulosin, and sildenafil.  No orders of the defined types were placed in this encounter.   Medications Discontinued During This Encounter  Medication Reason  . cefdinir (OMNICEF) 300 MG capsule   . oseltamivir (TAMIFLU) 75 MG capsule Completed Course  . promethazine-codeine (PHENERGAN WITH CODEINE) 6.25-10 MG/5ML syrup Completed  Course    Follow-up: Return in about 3 months (around 05/19/2018) for CPE .   Sherlene Shams, MD

## 2018-02-10 NOTE — Patient Instructions (Signed)
Good to see you again!  I am checking your electrolytes and antibody titers [facility]   Return in late October for your annual exam and fasting labs

## 2018-02-10 NOTE — Assessment & Plan Note (Signed)
Subjective report of chest tightness ever since his RMSF episode .  He has deferred workup with chest x ray and EKG until he returns for CPE in October.

## 2018-02-10 NOTE — Assessment & Plan Note (Signed)
Checking lytes today

## 2018-02-11 LAB — ROCKY MTN SPOTTED FVR ABS PNL(IGG+IGM)
RMSF IGM: NOT DETECTED
RMSF IgG: DETECTED — AB

## 2018-02-11 LAB — COMPREHENSIVE METABOLIC PANEL
ALK PHOS: 32 U/L — AB (ref 39–117)
ALT: 26 U/L (ref 0–53)
AST: 19 U/L (ref 0–37)
Albumin: 4.3 g/dL (ref 3.5–5.2)
BUN: 13 mg/dL (ref 6–23)
CALCIUM: 9.1 mg/dL (ref 8.4–10.5)
CO2: 24 mEq/L (ref 19–32)
Chloride: 106 mEq/L (ref 96–112)
Creatinine, Ser: 1.12 mg/dL (ref 0.40–1.50)
GFR: 71.84 mL/min (ref >60.00)
Glucose, Bld: 99 mg/dL (ref 70–99)
POTASSIUM: 4.1 meq/L (ref 3.5–5.1)
Sodium: 139 mEq/L (ref 135–145)
Total Bilirubin: 0.6 mg/dL (ref 0.2–1.2)
Total Protein: 7.1 g/dL (ref 6.0–8.3)

## 2018-02-11 LAB — TSH: TSH: 2.2 u[IU]/mL (ref 0.35–4.50)

## 2018-02-11 LAB — REFLEX RMSF IGG TITER: RMSF IgG Titer: 1:64 {titer} — ABNORMAL HIGH

## 2018-02-11 LAB — MAGNESIUM: MAGNESIUM: 2.1 mg/dL (ref 1.5–2.5)

## 2018-04-09 ENCOUNTER — Other Ambulatory Visit: Payer: Self-pay | Admitting: Urology

## 2018-05-21 ENCOUNTER — Ambulatory Visit (INDEPENDENT_AMBULATORY_CARE_PROVIDER_SITE_OTHER): Payer: BLUE CROSS/BLUE SHIELD | Admitting: Internal Medicine

## 2018-05-21 ENCOUNTER — Encounter: Payer: Self-pay | Admitting: Internal Medicine

## 2018-05-21 VITALS — BP 126/74 | HR 71 | Temp 97.7°F | Resp 15 | Ht 71.0 in | Wt 176.2 lb

## 2018-05-21 DIAGNOSIS — R252 Cramp and spasm: Secondary | ICD-10-CM | POA: Diagnosis not present

## 2018-05-21 DIAGNOSIS — G4762 Sleep related leg cramps: Secondary | ICD-10-CM | POA: Diagnosis not present

## 2018-05-21 DIAGNOSIS — Z23 Encounter for immunization: Secondary | ICD-10-CM

## 2018-05-21 DIAGNOSIS — Z0001 Encounter for general adult medical examination with abnormal findings: Secondary | ICD-10-CM

## 2018-05-21 DIAGNOSIS — Z0000 Encounter for general adult medical examination without abnormal findings: Secondary | ICD-10-CM

## 2018-05-21 DIAGNOSIS — Z125 Encounter for screening for malignant neoplasm of prostate: Secondary | ICD-10-CM

## 2018-05-21 DIAGNOSIS — Z7289 Other problems related to lifestyle: Secondary | ICD-10-CM | POA: Diagnosis not present

## 2018-05-21 DIAGNOSIS — Z13220 Encounter for screening for lipoid disorders: Secondary | ICD-10-CM | POA: Diagnosis not present

## 2018-05-21 DIAGNOSIS — L578 Other skin changes due to chronic exposure to nonionizing radiation: Secondary | ICD-10-CM

## 2018-05-21 DIAGNOSIS — Z789 Other specified health status: Secondary | ICD-10-CM

## 2018-05-21 LAB — COMPREHENSIVE METABOLIC PANEL
ALT: 39 U/L (ref 0–53)
AST: 26 U/L (ref 0–37)
Albumin: 4.6 g/dL (ref 3.5–5.2)
Alkaline Phosphatase: 39 U/L (ref 39–117)
BUN: 14 mg/dL (ref 6–23)
CHLORIDE: 104 meq/L (ref 96–112)
CO2: 26 mEq/L (ref 19–32)
Calcium: 9.6 mg/dL (ref 8.4–10.5)
Creatinine, Ser: 0.97 mg/dL (ref 0.40–1.50)
GFR: 84.73 mL/min (ref >60.00)
GLUCOSE: 94 mg/dL (ref 70–99)
POTASSIUM: 4.6 meq/L (ref 3.5–5.1)
Sodium: 139 mEq/L (ref 135–145)
Total Bilirubin: 0.6 mg/dL (ref 0.2–1.2)
Total Protein: 7 g/dL (ref 6.0–8.3)

## 2018-05-21 LAB — MAGNESIUM: MAGNESIUM: 2.1 mg/dL (ref 1.5–2.5)

## 2018-05-21 LAB — LIPID PANEL
Cholesterol: 166 mg/dL (ref 0–200)
HDL: 65.5 mg/dL (ref >39.00)
LDL CALC: 86 mg/dL (ref 0–99)
NONHDL: 100.89
Total CHOL/HDL Ratio: 3
Triglycerides: 75 mg/dL (ref 0.0–149.0)
VLDL: 15 mg/dL (ref 0.0–40.0)

## 2018-05-21 LAB — CK: Total CK: 197 U/L (ref 7–232)

## 2018-05-21 LAB — TSH: TSH: 1.41 u[IU]/mL (ref 0.35–4.50)

## 2018-05-21 LAB — PSA: PSA: 1.67 ng/mL (ref 0.10–4.00)

## 2018-05-21 MED ORDER — ZOSTER VAC RECOMB ADJUVANTED 50 MCG/0.5ML IM SUSR: 0.5000 mL | Freq: Once | INTRAMUSCULAR | 1 refills | Status: AC

## 2018-05-21 NOTE — Progress Notes (Signed)
Patient ID: Frank Perkins, male    DOB: 1960-09-26  Age: 57 y.o. MRN: [id number]  The patient is here for annual preventive examination and management of other chronic and acute problems.   The risk factors are reflected in the social history.  The roster of all physicians providing medical care to patient - is listed in the Snapshot section of the chart.  Activities of daily living:  The patient is 100% independent in all ADLs: dressing, toileting, feeding as well as independent mobility  Home safety : The patient has smoke detectors in the home. They wear seatbelts.  There are no unsecured firearms at home. There is no violence in the home.   There is no risks for hepatitis, STDs or HIV. There is no   history of blood transfusion. They have no travel history to infectious disease endemic areas of the world.  The patient has seen their dentist in the last six month. They have seen their eye doctor in the last year. They deny any hearing difficulty with regard to whispered voices or television programs.  They have deferred audiologic testing in the last year.  They do not  have excessive sun exposure. Discussed the need for sun protection: hats, long sleeves and use of sunscreen if there is significant sun exposure.   Diet: the importance of a healthy diet is discussed. They do have a healthy diet.  The benefits of regular aerobic exercise were discussed.  He does not exercise regularly . Has a physically strenuous job and has occasional back pain treated with NSAIDs.     Depression screen: there are no signs or vegative symptoms of depression- irritability, change in appetite, anhedonia, sadness/tearfullness.  The following portions of the patient's history were reviewed and updated as appropriate: allergies, current medications, past family history, past medical history,  past surgical history, past social history  and problem list.  Visual acuity was not assessed per patient preference  since she has regular follow up with her ophthalmologist. Hearing and body mass index were assessed and reviewed.   During the course of the visit the patient was educated and counseled about appropriate screening and preventive services including : fall prevention , diabetes screening, nutrition counseling, colorectal cancer screening, and recommended immunizations.    CC: The primary encounter diagnosis was Routine general medical examination at a health care facility. Diagnoses of Prostate cancer screening, Screening for hyperlipidemia, Muscle cramps, Need for immunization against influenza, Nocturnal leg cramps, Alcohol use, and Sun-damaged skin were also pertinent to this visit.  Leg cramps occurring frequently,  At night . Drinks gatorade during the day,  averages 36 ounces   History Frank Perkins has a past medical history of Bladder outlet obstruction, BPH (benign prostatic hyperplasia), ED (erectile dysfunction), GERD (gastroesophageal reflux disease), and Prostatitis.   He has a past surgical history that includes Nasal sinus surgery.   His family history includes Cancer (age of onset: 38) in his father; Dementia in his father; Esophageal cancer in his father; Healthy in his brother, mother, and sister; Prostate cancer in his father.He reports that he has never smoked. His smokeless tobacco use includes chew. He reports that he drinks alcohol. He reports that he does not use drugs.  Outpatient Medications Prior to Visit  Medication Sig Dispense Refill  . esomeprazole (NEXIUM) 20 MG capsule Take 20 mg by mouth daily at 12 noon.    . sildenafil (REVATIO) 20 MG tablet Take 3 to 5 tablets two hours before intercouse on an  empty stomach.  Do not take with nitrates. 50 tablet 3  . tamsulosin (FLOMAX) 0.4 MG CAPS capsule TAKE (1) CAPSULE BY MOUTH EVERY DAY 90 capsule 3   No facility-administered medications prior to visit.     Review of Systems   Patient denies headache, fevers, malaise,  unintentional weight loss, skin rash, eye pain, sinus congestion and sinus pain, sore throat, dysphagia,  hemoptysis , cough, dyspnea, wheezing, chest pain, palpitations, orthopnea, edema, abdominal pain, nausea, melena, diarrhea, constipation, flank pain, dysuria, hematuria, urinary  Frequency, nocturia, numbness, tingling, seizures,  Focal weakness, Loss of consciousness,  Tremor, insomnia, depression, anxiety, and suicidal ideation.     Objective:  BP 126/74 (BP Location: Left Arm, Patient Position: Sitting, Cuff Size: Normal)   Pulse 71   Temp 97.7 F (36.5 C) (Oral)   Resp 15   Ht 5\' 11"  (1.803 m)   Wt 176 lb 3.2 oz (79.9 kg)   SpO2 94%   BMI 24.57 kg/m   Physical Exam   General appearance: alert, cooperative and appears stated age Ears: normal TM's and external ear canals both ears Throat: lips, mucosa, and tongue normal; teeth and gums normal Neck: no adenopathy, no carotid bruit, supple, symmetrical, trachea midline and thyroid not enlarged, symmetric, no tenderness/mass/nodules Back: symmetric, no curvature. ROM normal. No CVA tenderness. Lungs: clear to auscultation bilaterally Heart: regular rate and rhythm, S1, S2 normal, no murmur, click, rub or gallop Abdomen: soft, non-tender; bowel sounds normal; no masses,  no organomegaly Pulses: 2+ and symmetric Skin: Skin color, texture, turgor normal. No rashes or lesions Lymph nodes: Cervical, supraclavicular, and axillary nodes normal.    Assessment & Plan:   Problem List Items Addressed This Visit    Alcohol use    Episodic,  CAGE questionnaire given  And negative. Advised to drink in moderation. Liver enzymes normal   Lab Results  Component Value Date   ALT 39 05/21/2018   AST 26 05/21/2018   ALKPHOS 39 05/21/2018   BILITOT 0.6 05/21/2018         Nocturnal leg cramps    Recurrent,  Despite using gatorade during the day. Exam normal including pulses.  Advised to Increase liquids,  Try magnesium supplement  Lab  Results  Component Value Date   NA 139 05/21/2018   K 4.6 05/21/2018   CL 104 05/21/2018   CO2 26 05/21/2018   Lab Results  Component Value Date   CKTOTAL 197 05/21/2018         Routine general medical examination at a health care facility - Primary    Annual comprehensive preventive exam was done as well as an evaluation and management of acute and chronic conditions .  During the course of the visit the patient was educated and counseled about appropriate screening and preventive services including :  diabetes screening, lipid analysis with projected  10 year  risk for CAD , nutrition counseling, prostate and colorectal cancer screening, and recommended immunizations.  Printed recommendations for health maintenance screenings was given.   Lab Results  Component Value Date   PSA 1.67 05/21/2018   PSA 2.22 10/27/2012          Other Visit Diagnoses    Prostate cancer screening       Relevant Orders   PSA (Completed)   Screening for hyperlipidemia       Relevant Orders   Comprehensive metabolic panel (Completed)   Lipid panel (Completed)   Muscle cramps       Relevant  Orders   Magnesium (Completed)   TSH (Completed)   CK (Creatine Kinase) (Completed)   Need for immunization against influenza       Relevant Orders   Flu Vaccine QUAD 36+ mos IM (Completed)   Sun-damaged skin       Relevant Orders   Ambulatory referral to Dermatology      I am having Frank Perkins start on Zoster Vaccine Adjuvanted. I am also having him maintain his esomeprazole, sildenafil, and tamsulosin.  Meds ordered this encounter  Medications  . Zoster Vaccine Adjuvanted [facility]) injection    Sig: Inject 0.5 mLs into the muscle once for 1 dose.    Dispense:  1 each    Refill:  1    There are no discontinued medications.  Follow-up: No follow-ups on file.   Sherlene Shams, MD

## 2018-05-21 NOTE — Patient Instructions (Addendum)
I am referring you to [facility] .  You are due for colon cancer screening.  . Check with your insurance about the cologuard test as a covered screening test for colon cancer.    You  Received the influenza vaccine today,  If you develop fevers,  Body aches , or headaches,  Do not panic,  This occurs in about 25% of patients who receive the flu vaccine  IT IS NOT THE FLU, but your immune system activating itself.   The ShingRx vaccine is now available in local pharmacies and is much more protective thant Zostavaxs,  It is therefore ADVISED for all interested adults over 50 to prevent shingles    You have a mild tendonitis of the elbow (lateral epicondylitis).  You dan take 2 aleve every 12 hours and up to 1000 mg of tylenol every 12 hours .  Ice for 15 minutes 2-3 times daily.  Avoid repetitive lifting    Tennis Elbow Tennis elbow (lateral epicondylitis) is inflammation of the outer tendons of your forearm close to your elbow. Your tendons attach your muscles to your bones. The outer tendons of your forearm are used to extend your wrist, and they attach on the outside part of your elbow. Tennis elbow is often found in people who play tennis, but anyone may get the condition from repeatedly extending the wrist or turning the forearm. What are the causes? This condition is caused by repeatedly extending your wrist and using your hands. It can result from sports or work that requires repetitive forearm movements. Tennis elbow may also be caused by an injury. What increases the risk? You have a higher risk of developing tennis elbow if you play tennis or another racquet sport. You also have a higher risk if you frequently use your hands for work. This condition is also more likely to develop in:  Musicians.  Carpenters, painters, and plumbers.  Cooks.  Cashiers.  People who work in [employer].  Architect workers.  Butchers.  People who use computers.  What are the signs or  symptoms? Symptoms of this condition include:  Pain and tenderness in your forearm and the outer part of your elbow. You may only feel the pain when you use your arm, or you may feel it even when you are not using your arm.  A burning feeling that runs from your elbow through your arm.  Weak grip in your hands.  How is this diagnosed? This condition may be diagnosed by medical history and physical exam. You may also have other tests, including:  X-rays.  MRI.  How is this treated? Your health care provider will recommend lifestyle adjustments, such as resting and icing your arm. Treatment may also include:  Medicines for inflammation. This may include shots of cortisone if your pain continues.  Physical therapy. This may include massage or exercises.  An elbow brace.  Surgery may eventually be recommended if your pain does not go away with treatment. Follow these instructions at home: Activity  Rest your elbow and wrist as directed by your health care provider. Try to avoid any activities that caused the problem until your health care provider says that you can do them again.  If a physical therapist teaches you exercises, do all of them as directed.  If you lift an object, lift it with your palm facing upward. This lowers the stress on your elbow. Lifestyle  If your tennis elbow is caused by sports, check your equipment and make sure that: ?  You are using it correctly. ? It is the best fit for you.  If your tennis elbow is caused by work, take breaks frequently, if you are able. Talk with your manager about how to best perform tasks in a way that is safe. ? If your tennis elbow is caused by computer use, talk with your manager about any changes that can be made to your work environment. General instructions  If directed, apply ice to the painful area: ? Put ice in a plastic bag. ? Place a towel between your skin and the bag. ? Leave the ice on for 20 minutes, 2-3 times  per day.  Take medicines only as directed by your health care provider.  If you were given a brace, wear it as directed by your health care provider.  Keep all follow-up visits as directed by your health care provider. This is important. Contact a health care provider if:  Your pain does not get better with treatment.  Your pain gets worse.  You have numbness or weakness in your forearm, hand, or fingers. This information is not intended to replace advice given to you by your health care provider. Make sure you discuss any questions you have with your health care provider. Document Released: 07/09/2005 Document Revised: 03/08/2016 Document Reviewed: 07/05/2014 Elsevier Interactive Patient Education  2018 [place] Years, Male Preventive care refers to lifestyle choices and visits with your health care provider that can promote health and wellness. What does preventive care include?  A yearly physical exam. This is also called an annual well check.  Dental exams once or twice a year.  Routine eye exams. Ask your health care provider how often you should have your eyes checked.  Personal lifestyle choices, including: ? Daily care of your teeth and gums. ? Regular physical activity. ? Eating a healthy diet. ? Avoiding tobacco and drug use. ? Limiting alcohol use. ? Practicing safe sex. ? Taking low-dose aspirin every day starting at age 90. What happens during an annual well check? The services and screenings done by your health care provider during your annual well check will depend on your age, overall health, lifestyle risk factors, and family history of disease. Counseling Your health care provider may ask you questions about your:  Alcohol use.  Tobacco use.  Drug use.  Emotional well-being.  Home and relationship well-being.  Sexual activity.  Eating habits.  Work and work Statistician.  Screening You may have the following  tests or measurements:  Height, weight, and BMI.  Blood pressure.  Lipid and cholesterol levels. These may be checked every 5 years, or more frequently if you are over 41 years old.  Skin check.  Lung cancer screening. You may have this screening every year starting at age 64 if you have a 30-pack-year history of smoking and currently smoke or have quit within the past 15 years.  Fecal occult blood test (FOBT) of the stool. You may have this test every year starting at age 59.  Flexible sigmoidoscopy or colonoscopy. You may have a sigmoidoscopy every 5 years or a colonoscopy every 10 years starting at age 15.  Prostate cancer screening. Recommendations will vary depending on your family history and other risks.  Hepatitis C blood test.  Hepatitis B blood test.  Sexually transmitted disease (STD) testing.  Diabetes screening. This is done by checking your blood sugar (glucose) after you have not eaten for a while (fasting). You may have this  done every 1-3 years.  Discuss your test results, treatment options, and if necessary, the need for more tests with your health care provider. Vaccines Your health care provider may recommend certain vaccines, such as:  Influenza vaccine. This is recommended every year.  Tetanus, diphtheria, and acellular pertussis (Tdap, Td) vaccine. You may need a Td booster every 10 years.  Varicella vaccine. You may need this if you have not been vaccinated.  Zoster vaccine. You may need this after age 49.  Measles, mumps, and rubella (MMR) vaccine. You may need at least one dose of MMR if you were born in 1957 or later. You may also need a second dose.  Pneumococcal 13-valent conjugate (PCV13) vaccine. You may need this if you have certain conditions and have not been vaccinated.  Pneumococcal polysaccharide (PPSV23) vaccine. You may need one or two doses if you smoke cigarettes or if you have certain conditions.  Meningococcal vaccine. You may  need this if you have certain conditions.  Hepatitis A vaccine. You may need this if you have certain conditions or if you travel or work in places where you may be exposed to hepatitis A.  Hepatitis B vaccine. You may need this if you have certain conditions or if you travel or work in places where you may be exposed to hepatitis B.  Haemophilus influenzae type b (Hib) vaccine. You may need this if you have certain risk factors.  Talk to your health care provider about which screenings and vaccines you need and how often you need them. This information is not intended to replace advice given to you by your health care provider. Make sure you discuss any questions you have with your health care provider. Document Released: 08/05/2015 Document Revised: 03/28/2016 Document Reviewed: 05/10/2015 Elsevier Interactive Patient Education  [employer].

## 2018-05-22 NOTE — Assessment & Plan Note (Signed)
Recurrent,  Despite using gatorade during the day. Exam normal including pulses.  Advised to Increase liquids,  Try magnesium supplement  Lab Results  Component Value Date   NA 139 05/21/2018   K 4.6 05/21/2018   CL 104 05/21/2018   CO2 26 05/21/2018   Lab Results  Component Value Date   CKTOTAL 197 05/21/2018

## 2018-05-22 NOTE — Assessment & Plan Note (Signed)
Annual comprehensive preventive exam was done as well as an evaluation and management of acute and chronic conditions .  During the course of the visit the patient was educated and counseled about appropriate screening and preventive services including :  diabetes screening, lipid analysis with projected  10 year  risk for CAD , nutrition counseling, prostate and colorectal cancer screening, and recommended immunizations.  Printed recommendations for health maintenance screenings was given.   Lab Results  Component Value Date   PSA 1.67 05/21/2018   PSA 2.22 10/27/2012

## 2018-05-22 NOTE — Assessment & Plan Note (Signed)
Episodic,  CAGE questionnaire given  And negative. Advised to drink in moderation. Liver enzymes normal   Lab Results  Component Value Date   ALT 39 05/21/2018   AST 26 05/21/2018   ALKPHOS 39 05/21/2018   BILITOT 0.6 05/21/2018

## 2018-06-18 ENCOUNTER — Other Ambulatory Visit: Payer: Self-pay

## 2018-06-18 DIAGNOSIS — N138 Other obstructive and reflux uropathy: Secondary | ICD-10-CM

## 2018-06-18 DIAGNOSIS — N401 Enlarged prostate with lower urinary tract symptoms: Principal | ICD-10-CM

## 2018-06-23 ENCOUNTER — Encounter: Payer: Self-pay | Admitting: Urology

## 2018-06-23 ENCOUNTER — Other Ambulatory Visit: Payer: Self-pay

## 2018-06-26 ENCOUNTER — Ambulatory Visit: Payer: Self-pay | Admitting: Urology

## 2018-06-26 ENCOUNTER — Encounter: Payer: Self-pay | Admitting: Urology

## 2018-10-21 ENCOUNTER — Telehealth: Payer: Self-pay | Admitting: Urology

## 2018-10-21 ENCOUNTER — Other Ambulatory Visit: Payer: Self-pay | Admitting: Urology

## 2018-10-21 NOTE — Telephone Encounter (Signed)
Called pharmacy canceled rx as pt has not been seen in a year. Pharmacy cancelled RX.

## 2018-10-21 NOTE — Telephone Encounter (Signed)
Frank Perkins has not been seen since 2018.  He cannot have a refill for his sildenafil at this time. I can't do a virtual visit either as it has been over a year.  He can have his PCP fill it or schedule an appointment with [place] in three months.

## 2018-10-24 ENCOUNTER — Telehealth: Payer: Self-pay

## 2018-10-24 MED ORDER — PREDNISONE 10 MG PO TABS: ORAL_TABLET | ORAL | 0 refills | Status: DC

## 2018-10-24 MED ORDER — AMOXICILLIN-POT CLAVULANATE 875-125 MG PO TABS: 1.0000 | ORAL_TABLET | Freq: Two times a day (BID) | ORAL | 0 refills | Status: DC

## 2018-10-24 NOTE — Telephone Encounter (Signed)
meds sent

## 2018-10-24 NOTE — Telephone Encounter (Signed)
Spoke with pt's wife and informed her that medication have been sent in.

## 2018-10-24 NOTE — Telephone Encounter (Signed)
Copied from CRM [id number]. Topic: Quick Communication - Rx Refill/Question >> Oct 24, 2018  1:07 PM Crist Infante wrote: Medication: amoxicillin-clavulanate (AUGMENTIN) 875-125 MG per tablet  Possibly prednisone Wife states pt has a lot of drainage, which has turned dark.  Drainage in back of throat.  Pt has had this sinus issue about 2 weeks. She states it is not in his chest, no fever, no SOB. Pt is at work right now, she is calling for him. Wife states Dr Darrick Huntsman is aware pt has had sinus surgery, and is susceptible to sinus infection. She hopes Dr Darrick Huntsman will call something in for the pt.   [id number] - [place], [place] - [address] [phone] (Phone) [phone] (Fax)

## 2018-10-24 NOTE — Addendum Note (Signed)
Addended by: Sherlene Shams on: 10/24/2018 04:55 PM   Modules accepted: Orders

## 2018-11-07 ENCOUNTER — Other Ambulatory Visit: Payer: Self-pay | Admitting: Internal Medicine

## 2018-11-07 DIAGNOSIS — N529 Male erectile dysfunction, unspecified: Secondary | ICD-10-CM

## 2018-11-07 MED ORDER — SILDENAFIL CITRATE 20 MG PO TABS: ORAL_TABLET | ORAL | 5 refills | Status: DC

## 2018-12-19 ENCOUNTER — Ambulatory Visit: Payer: BLUE CROSS/BLUE SHIELD | Admitting: Internal Medicine

## 2019-01-22 ENCOUNTER — Other Ambulatory Visit: Payer: Self-pay | Admitting: Urology

## 2019-01-22 DIAGNOSIS — N529 Male erectile dysfunction, unspecified: Secondary | ICD-10-CM

## 2019-04-28 ENCOUNTER — Other Ambulatory Visit: Payer: Self-pay | Admitting: Urology

## 2019-05-21 ENCOUNTER — Other Ambulatory Visit: Payer: Self-pay

## 2019-05-25 ENCOUNTER — Other Ambulatory Visit: Payer: Self-pay | Admitting: Urology

## 2019-05-25 ENCOUNTER — Other Ambulatory Visit: Payer: Self-pay

## 2019-05-25 ENCOUNTER — Encounter: Payer: Self-pay | Admitting: Internal Medicine

## 2019-05-25 ENCOUNTER — Ambulatory Visit (INDEPENDENT_AMBULATORY_CARE_PROVIDER_SITE_OTHER): Payer: BLUE CROSS/BLUE SHIELD | Admitting: Internal Medicine

## 2019-05-25 VITALS — BP 150/96 | HR 88 | Temp 98.0°F | Resp 14 | Ht 71.0 in | Wt 173.4 lb

## 2019-05-25 DIAGNOSIS — Z0000 Encounter for general adult medical examination without abnormal findings: Secondary | ICD-10-CM | POA: Diagnosis not present

## 2019-05-25 DIAGNOSIS — Z125 Encounter for screening for malignant neoplasm of prostate: Secondary | ICD-10-CM

## 2019-05-25 DIAGNOSIS — Z1211 Encounter for screening for malignant neoplasm of colon: Secondary | ICD-10-CM

## 2019-05-25 DIAGNOSIS — Z789 Other specified health status: Secondary | ICD-10-CM

## 2019-05-25 DIAGNOSIS — Z23 Encounter for immunization: Secondary | ICD-10-CM | POA: Diagnosis not present

## 2019-05-25 DIAGNOSIS — R634 Abnormal weight loss: Secondary | ICD-10-CM | POA: Diagnosis not present

## 2019-05-25 DIAGNOSIS — R030 Elevated blood-pressure reading, without diagnosis of hypertension: Secondary | ICD-10-CM

## 2019-05-25 DIAGNOSIS — L989 Disorder of the skin and subcutaneous tissue, unspecified: Secondary | ICD-10-CM | POA: Diagnosis not present

## 2019-05-25 DIAGNOSIS — Z7289 Other problems related to lifestyle: Secondary | ICD-10-CM

## 2019-05-25 DIAGNOSIS — N529 Male erectile dysfunction, unspecified: Secondary | ICD-10-CM

## 2019-05-25 MED ORDER — IPRATROPIUM BROMIDE 0.06 % NA SOLN: 2.0000 | Freq: Four times a day (QID) | NASAL | 12 refills | Status: DC

## 2019-05-25 NOTE — Assessment & Plan Note (Signed)
He continues to rink 4 to 6 beers every night , sometimes along with 2 liqor drinks.  counselling provided

## 2019-05-25 NOTE — Assessment & Plan Note (Signed)

## 2019-05-25 NOTE — Assessment & Plan Note (Signed)
Two rough patches on temples with telangiectasisas. Suspicious for skin CA

## 2019-05-25 NOTE — Progress Notes (Signed)
Patient ID: Frank Perkins, male    DOB: Jan 31, 1961  Age: 58 y.o. MRN: [id number]  The patient is here for annual wellness examination and management of other chronic and acute problems.  Due for PSA  Due for Hep C ,  HIV,  Colonoscopy  Wants it done at [facility] lasik surgery at [facility] years ago,  Has not worn glasses since   Feels hot all the time ever since the tick bite in 2019 Needs referrala to [facility] for irritated skin tag in  left axilla, and bilatera temporal lesions consistent with AKs     The risk factors are reflected in the social history.  The roster of all physicians providing medical care to patient - is listed in the Snapshot section of the chart.  Activities of daily living:  The patient is 100% independent in all ADLs: dressing, toileting, feeding as well as independent mobility  Home safety : The patient has smoke detectors in the home. They wear seatbelts.  There are no firearms at home. There is no violence in the home.   There is no risks for hepatitis, STDs or HIV. There is no   history of blood transfusion. They have no travel history to infectious disease endemic areas of the world.  The patient has seen their dentist in the last six month. They have seen their eye doctor in the last year. They admit to slight hearing difficulty with regard to whispered voices and some television programs.  They have deferred audiologic testing in the last year.  They do not  have excessive sun exposure. Discussed the need for sun protection: hats, long sleeves and use of sunscreen if there is significant sun exposure.   Diet: the importance of a healthy diet is discussed. They do have a healthy diet.  The benefits of regular aerobic exercise were discussed. She walks 4 times per week ,  20 minutes.   Depression screen: there are no signs or vegative symptoms of depression- irritability, change in appetite, anhedonia, sadness/tearfullness.  Cognitive assessment: the patient  manages all their financial and personal affairs and is actively engaged. They could relate day,date,year and events; recalled 2/3 objects at 3 minutes; performed clock-face test normally.  The following portions of the patient's history were reviewed and updated as appropriate: allergies, current medications, past family history, past medical history,  past surgical history, past social history  and problem list.  Visual acuity was not assessed per patient preference since she has regular follow up with her ophthalmologist. Hearing and body mass index were assessed and reviewed.   During the course of the visit the patient was educated and counseled about appropriate screening and preventive services including : fall prevention , diabetes screening, nutrition counseling, colorectal cancer screening, and recommended immunizations.    CC: The primary encounter diagnosis was Colon cancer screening. Diagnoses of Skin lesion, Recent unexplained weight loss, Prostate cancer screening, Elevated blood pressure reading without diagnosis of hypertension, Need for immunization against influenza, Routine general medical examination at a health care facility, Alcohol use, and Skin lesion of face were also pertinent to this visit.   1) elevated blood pressure :  No history of hypertension.  Taking sudafed daily for the past several months for congestion and running   History Frank Perkins has a past medical history of Bladder outlet obstruction, BPH (benign prostatic hyperplasia), ED (erectile dysfunction), GERD (gastroesophageal reflux disease), and Prostatitis.   He has a past surgical history that includes Nasal sinus surgery.  His family history includes Cancer (age of onset: 68) in his father; Dementia in his father; Esophageal cancer in his father; Healthy in his brother, mother, and sister; Prostate cancer in his father.He reports that he has never smoked. His smokeless tobacco use includes chew. He reports  current alcohol use. He reports that he does not use drugs.  Outpatient Medications Prior to Visit  Medication Sig Dispense Refill  . esomeprazole (NEXIUM) 20 MG capsule Take 20 mg by mouth daily at 12 noon.    . sildenafil (REVATIO) 20 MG tablet Take 3 to 5 tablets two hours prior to intercourse on an empty stomach. 50 tablet 5  . tamsulosin (FLOMAX) 0.4 MG CAPS capsule TAKE ONE (1) CAPSULE EACH DAY. 90 capsule 3  . amoxicillin-clavulanate (AUGMENTIN) 875-125 MG tablet Take 1 tablet by mouth 2 (two) times daily. (Patient not taking: Reported on 05/25/2019) 14 tablet 0  . predniSONE (DELTASONE) 10 MG tablet 6 tablets on Day 1 , then reduce by 1 tablet daily until gone (Patient not taking: Reported on 05/25/2019) 21 tablet 0   No facility-administered medications prior to visit.     Review of Systems   Patient denies headache, fevers, malaise, unintentional weight loss, skin rash, eye pain, sinus congestion and sinus pain, sore throat, dysphagia,  hemoptysis , cough, dyspnea, wheezing, chest pain, palpitations, orthopnea, edema, abdominal pain, nausea, melena, diarrhea, constipation, flank pain, dysuria, hematuria, urinary  Frequency, nocturia, numbness, tingling, seizures,  Focal weakness, Loss of consciousness,  Tremor, insomnia, depression, anxiety, and suicidal ideation.      Objective:  BP (!) 150/96 (BP Location: Left Arm, Patient Position: Sitting, Cuff Size: Normal)   Pulse 88   Temp 98 F (36.7 C) (Temporal)   Resp 14   Ht 5\' 11"  (1.803 m)   Wt 173 lb 6.4 oz (78.7 kg)   SpO2 97%   BMI 24.18 kg/m   Physical Exam  General appearance: alert, cooperative and appears stated age Ears: normal TM's and external ear canals both ears Throat: lips, mucosa, and tongue normal; teeth and gums normal Neck: no adenopathy, no carotid bruit, supple, symmetrical, trachea midline and thyroid not enlarged, symmetric, no tenderness/mass/nodules Back: symmetric, no curvature. ROM normal. No CVA  tenderness. Lungs: clear to auscultation bilaterally Heart: regular rate and rhythm, S1, S2 normal, no murmur, click, rub or gallop Abdomen: soft, non-tender; bowel sounds normal; no masses,  no organomegaly Pulses: 2+ and symmetric Skin: Skin color, texture, turgor normal. Bilateral patches of rough skin with telangiectasias . Left axillary skin tag.  Lymph nodes: Cervical, supraclavicular, and axillary nodes normal.  Assessment & Plan:   Problem List Items Addressed This Visit      Unprioritized   Routine general medical examination at a health care facility    age appropriate education and counseling updated, referrals for preventative services and immunizations addressed, dietary and smoking counseling addressed, most recent labs reviewed.  I have personally reviewed and have noted:  1) the patient's medical and social history 2) The pt's use of alcohol, tobacco, and illicit drugs 3) The patient's current medications and supplements 4) Functional ability including ADL's, fall risk, home safety risk, hearing and visual impairment 5) Diet and physical activities 6) Evidence for depression or mood disorder 7) The patient's height, weight, and BMI have been recorded in the chart  I have made referrals, and provided counseling and education based on review of the above      Alcohol use    He continues to rink  4 to 6 beers every night , sometimes along with 2 liqor drinks.  counselling provided       Skin lesion of face    Two rough patches on temples with telangiectasisas. Suspicious for skin CA        Other Visit Diagnoses    Colon cancer screening    -  Primary   Relevant Orders   Ambulatory referral to Gastroenterology   Skin lesion       Relevant Orders   Ambulatory referral to Dermatology   Recent unexplained weight loss       Relevant Orders   CBC with Differential/Platelet   TSH   Prostate cancer screening       Relevant Orders   PSA   Elevated blood pressure  reading without diagnosis of hypertension       Relevant Orders   Microalbumin / creatinine urine ratio   Comprehensive metabolic panel   Lipid panel   Need for immunization against influenza       Relevant Orders   Flu Vaccine QUAD 36+ mos IM (Completed)      I have discontinued Frank StallionGeorge A. Perkins "Frank Perkins"'s amoxicillin-clavulanate and predniSONE. I am also having him start on ipratropium. Additionally, I am having him maintain his esomeprazole, sildenafil, and tamsulosin.  Meds ordered this encounter  Medications  . ipratropium (ATROVENT) 0.06 % nasal spray    Sig: Place 2 sprays into both nostrils 4 (four) times daily.    Dispense:  15 mL    Refill:  12    Medications Discontinued During This Encounter  Medication Reason  . amoxicillin-clavulanate (AUGMENTIN) 875-125 MG tablet Completed Course  . predniSONE (DELTASONE) 10 MG tablet     Follow-up: Return in about 1 week (around 06/01/2019).   Sherlene Shamseresa L Irish Breisch, MD

## 2019-05-25 NOTE — Patient Instructions (Addendum)
Stop the sudafed today .    Check Blood pressure  starting Thursday and when you return for  labs we will check with the RN as well    GI referral and dermatology referrals made  Health Maintenance, Male Adopting a healthy lifestyle and getting preventive care are important in promoting health and wellness. Ask your health care provider about:  The right schedule for you to have regular tests and exams.  Things you can do on your own to prevent diseases and keep yourself healthy. What should I know about diet, weight, and exercise? Eat a healthy diet   Eat a diet that includes plenty of vegetables, fruits, low-fat dairy products, and lean protein.  Do not eat a lot of foods that are high in solid fats, added sugars, or sodium. Maintain a healthy weight Body mass index (BMI) is a measurement that can be used to identify possible weight problems. It estimates body fat based on height and weight. Your health care provider can help determine your BMI and help you achieve or maintain a healthy weight. Get regular exercise Get regular exercise. This is one of the most important things you can do for your health. Most adults should:  Exercise for at least 150 minutes each week. The exercise should increase your heart rate and make you sweat (moderate-intensity exercise).  Do strengthening exercises at least twice a week. This is in addition to the moderate-intensity exercise.  Spend less time sitting. Even light physical activity can be beneficial. Watch cholesterol and blood lipids Have your blood tested for lipids and cholesterol at 58 years of age, then have this test every 5 years. You may need to have your cholesterol levels checked more often if:  Your lipid or cholesterol levels are high.  You are older than 58 years of age.  You are at high risk for heart disease. What should I know about cancer screening? Many types of cancers can be detected early and may often be prevented.  Depending on your health history and family history, you may need to have cancer screening at various ages. This may include screening for:  Colorectal cancer.  Prostate cancer.  Skin cancer.  Lung cancer. What should I know about heart disease, diabetes, and high blood pressure? Blood pressure and heart disease  High blood pressure causes heart disease and increases the risk of stroke. This is more likely to develop in people who have high blood pressure readings, are of African descent, or are overweight.  Talk with your health care provider about your target blood pressure readings.  Have your blood pressure checked: ? Every 3-5 years if you are 60-75 years of age. ? Every year if you are 19 years old or older.  If you are between the ages of 53 and 84 and are a current or former smoker, ask your health care provider if you should have a one-time screening for abdominal aortic aneurysm (AAA). Diabetes Have regular diabetes screenings. This checks your fasting blood sugar level. Have the screening done:  Once every three years after age 50 if you are at a normal weight and have a low risk for diabetes.  More often and at a younger age if you are overweight or have a high risk for diabetes. What should I know about preventing infection? Hepatitis B If you have a higher risk for hepatitis B, you should be screened for this virus. Talk with your health care provider to find out if you are at  risk for hepatitis B infection. Hepatitis C Blood testing is recommended for:  Everyone born from [birth year] through 1965.  Anyone with known risk factors for hepatitis C. Sexually transmitted infections (STIs)  You should be screened each year for STIs, including gonorrhea and chlamydia, if: ? You are sexually active and are younger than 59 years of age. ? You are older than 58 years of age and your health care provider tells you that you are at risk for this type of infection. ? Your sexual  activity has changed since you were last screened, and you are at increased risk for chlamydia or gonorrhea. Ask your health care provider if you are at risk.  Ask your health care provider about whether you are at high risk for HIV. Your health care provider may recommend a prescription medicine to help prevent HIV infection. If you choose to take medicine to prevent HIV, you should first get tested for HIV. You should then be tested every 3 months for as long as you are taking the medicine. Follow these instructions at home: Lifestyle  Do not use any products that contain nicotine or tobacco, such as cigarettes, e-cigarettes, and chewing tobacco. If you need help quitting, ask your health care provider.  Do not use street drugs.  Do not share needles.  Ask your health care provider for help if you need support or information about quitting drugs. Alcohol use  Do not drink alcohol if your health care provider tells you not to drink.  If you drink alcohol: ? Limit how much you have to 0-2 drinks a day. ? Be aware of how much alcohol is in your drink. In the [place] one drink equals one 12 oz bottle of beer (355 mL), one 5 oz glass of wine (148 mL), or one 1 oz glass of hard liquor (44 mL). General instructions  Schedule regular health, dental, and eye exams.  Stay current with your vaccines.  Tell your health care provider if: ? You often feel depressed. ? You have ever been abused or do not feel safe at home. Summary  Adopting a healthy lifestyle and getting preventive care are important in promoting health and wellness.  Follow your health care provider's instructions about healthy diet, exercising, and getting tested or screened for diseases.  Follow your health care provider's instructions on monitoring your cholesterol and blood pressure. This information is not intended to replace advice given to you by your health care provider. Make sure you discuss any questions you have  with your health care provider. Document Released: 01/05/2008 Document Revised: 07/02/2018 Document Reviewed: 07/02/2018 Elsevier Patient Education  2020 [employer].

## 2019-05-28 ENCOUNTER — Telehealth: Payer: Self-pay

## 2019-05-28 NOTE — Telephone Encounter (Signed)
Agree with dr Frank Perkins directions..  No antibiotics are indicated for 2 days of sinus congestion and related symptoms .  Sinus rinses ,  Afrin nasal spray,  Sudafed PE all need to be used for 5 days before any antibiotics will be considered

## 2019-05-28 NOTE — Telephone Encounter (Signed)
Patient c/o bloody discharge when blowing his nose or rinsing out his sinuses.  Patient says that pain is worse on his right sinuses.  Pt said he had surgery on right sinus 7 or 8 years ago. Patien also c/o of right ear pain.  Pt said symptoms started on Wed after appt w/ Dr. Derrel Nip.  Pt denies having fever, no cough, no congestion.  Patient said he is only using flonase and a salt water rinse.

## 2019-05-28 NOTE — Telephone Encounter (Signed)
Patient given advisement per Dr. Bary Leriche note.  Patient voiced understanding with no concerns or questions.

## 2019-05-28 NOTE — Telephone Encounter (Signed)
Called and spoke to patient's wife.  Pt's wife said that pt had blood this morning from his sinus rinse which she said is usually a sign of infection.  Pt is now also having right ear pain and pressure.  Pt's wife said that patient was just seen by Dr. Derrel Nip on Tuesday and is requesting an antibiotic.  Advised pt that Dr. Derrel Nip is out-of-the office this afternoon but will forward it to a her and a covering provider for review.

## 2019-05-28 NOTE — Telephone Encounter (Signed)
Copied from [id number] [id number] [id number]. Topic: General - Inquiry >> May 28, 2019  3:16 PM Mathis Bud wrote: Reason for CRM: Patient wife Aram Beecham called requesting an antibiotic for patient due to his sinuses.  Patient just had an appt with PCP on Tuesday for this issues Patient is getting worse and would like something else.  Call back [phone]  [id number] - [place], [place] [phone] (Phone) [phone] (Fax)

## 2019-05-28 NOTE — Telephone Encounter (Signed)
Reviewed.  Given symptoms just started two days ago, recommend continuing the saline nasal spray - can flush nose 2-3x/day and flonase.  Can also start mucinex - this is a mucolytic - will thin the mucus.  This may alleviate some pressure.  Forward to Dr Derrel Nip.  Have him call with update.  If persistent or worsening symptoms, may need further treatment/abx, etc.

## 2019-05-28 NOTE — Telephone Encounter (Signed)
Please find out from pt, when symptoms started and what specific symptoms he is having.  Also, what is he using at this point.  I

## 2019-05-29 NOTE — Telephone Encounter (Signed)
Called patient.  Not available.  Spoke to his wife.  Informed wife that Dr. Derrel Nip is in agreement with recommendations given from covering provider, Dr. Nicki Reaper on yesterday.  Wife will inform patient.

## 2019-06-02 ENCOUNTER — Other Ambulatory Visit: Payer: Self-pay

## 2019-06-02 ENCOUNTER — Other Ambulatory Visit (INDEPENDENT_AMBULATORY_CARE_PROVIDER_SITE_OTHER): Payer: BLUE CROSS/BLUE SHIELD

## 2019-06-02 ENCOUNTER — Ambulatory Visit (INDEPENDENT_AMBULATORY_CARE_PROVIDER_SITE_OTHER): Payer: BLUE CROSS/BLUE SHIELD

## 2019-06-02 VITALS — BP 128/70 | HR 67

## 2019-06-02 DIAGNOSIS — R634 Abnormal weight loss: Secondary | ICD-10-CM | POA: Diagnosis not present

## 2019-06-02 DIAGNOSIS — Z125 Encounter for screening for malignant neoplasm of prostate: Secondary | ICD-10-CM

## 2019-06-02 DIAGNOSIS — I10 Essential (primary) hypertension: Secondary | ICD-10-CM | POA: Diagnosis not present

## 2019-06-02 DIAGNOSIS — Z0000 Encounter for general adult medical examination without abnormal findings: Secondary | ICD-10-CM | POA: Diagnosis not present

## 2019-06-02 DIAGNOSIS — R030 Elevated blood-pressure reading, without diagnosis of hypertension: Secondary | ICD-10-CM | POA: Diagnosis not present

## 2019-06-02 LAB — CBC WITH DIFFERENTIAL/PLATELET
Basophils Absolute: 0 10*3/uL (ref 0.0–0.1)
Basophils Relative: 0.8 % (ref 0.0–3.0)
Eosinophils Absolute: 0 10*3/uL (ref 0.0–0.7)
Eosinophils Relative: 0.9 % (ref 0.0–5.0)
HCT: 45 % (ref 39.0–52.0)
Hemoglobin: 15.6 g/dL (ref 13.0–17.0)
Lymphocytes Relative: 30.1 % (ref 12.0–46.0)
Lymphs Abs: 1.3 10*3/uL (ref 0.7–4.0)
MCHC: 34.6 g/dL (ref 30.0–36.0)
MCV: 97.1 fl (ref 78.0–100.0)
Monocytes Absolute: 0.5 10*3/uL (ref 0.1–1.0)
Monocytes Relative: 11.8 % (ref 3.0–12.0)
Neutro Abs: 2.4 10*3/uL (ref 1.4–7.7)
Neutrophils Relative %: 56.4 % (ref 43.0–77.0)
Platelets: 209 10*3/uL (ref 150.0–400.0)
RBC: 4.64 Mil/uL (ref 4.22–5.81)
RDW: 12.1 % (ref 11.5–15.5)
WBC: 4.2 10*3/uL (ref 4.0–10.5)

## 2019-06-02 LAB — COMPREHENSIVE METABOLIC PANEL
ALT: 29 U/L (ref 0–53)
AST: 22 U/L (ref 0–37)
Albumin: 4.6 g/dL (ref 3.5–5.2)
Alkaline Phosphatase: 41 U/L (ref 39–117)
BUN: 10 mg/dL (ref 6–23)
CO2: 27 mEq/L (ref 19–32)
Calcium: 9.2 mg/dL (ref 8.4–10.5)
Chloride: 105 mEq/L (ref 96–112)
Creatinine, Ser: 0.92 mg/dL (ref 0.40–1.50)
GFR: 84.43 mL/min (ref >60.00)
Glucose, Bld: 104 mg/dL — ABNORMAL HIGH (ref 70–99)
Potassium: 4.3 mEq/L (ref 3.5–5.1)
Sodium: 138 mEq/L (ref 135–145)
Total Bilirubin: 1.1 mg/dL (ref 0.2–1.2)
Total Protein: 6.8 g/dL (ref 6.0–8.3)

## 2019-06-02 LAB — MICROALBUMIN / CREATININE URINE RATIO
Creatinine,U: 20.3 mg/dL
Microalb Creat Ratio: 3.4 mg/g (ref 0.0–30.0)
Microalb, Ur: <0.7 mg/dL (ref 0.0–1.9)

## 2019-06-02 LAB — TSH: TSH: 1.18 u[IU]/mL (ref 0.35–4.50)

## 2019-06-02 LAB — LIPID PANEL
Cholesterol: 165 mg/dL (ref 0–200)
HDL: 77.8 mg/dL (ref >39.00)
LDL Cholesterol: 77 mg/dL (ref 0–99)
NonHDL: 86.84
Total CHOL/HDL Ratio: 2
Triglycerides: 51 mg/dL (ref 0.0–149.0)
VLDL: 10.2 mg/dL (ref 0.0–40.0)

## 2019-06-02 LAB — PSA: PSA: 2.03 ng/mL (ref 0.10–4.00)

## 2019-06-03 LAB — HEPATITIS C ANTIBODY
Hepatitis C Ab: NONREACTIVE
SIGNAL TO CUT-OFF: 0.01 (ref <1.00)

## 2019-06-03 LAB — HIV ANTIBODY (ROUTINE TESTING W REFLEX): HIV 1&2 Ab, 4th Generation: NONREACTIVE

## 2019-06-03 NOTE — Progress Notes (Addendum)
Patient presented today for nurse visit BP check per order from 05/25/19.  Patient reports compliance with prescribed BP medications: N/A  Last dose of BP medication: N/A  Patient reported that he is not on any blood pressure medications.  Patient contributes previous elevated bp to taking sudafed daily.  Patient said that he has stopped taking sudafed.  BP Readings from Last 3 Encounters:  06/02/19 128/70  05/25/19 (!) 150/96  05/21/18 126/74   Pulse Readings from Last 3 Encounters:  06/02/19 67  05/25/19 88  05/21/18 [place], CMA   Reviewed above information.  It appears since stopping sudafed that his blood pressure is doing well.  Continue to follow.    Dr Nicki Reaper

## 2019-06-05 ENCOUNTER — Telehealth: Payer: Self-pay

## 2019-06-05 ENCOUNTER — Encounter: Payer: Self-pay | Admitting: *Deleted

## 2019-06-05 MED ORDER — FLUTICASONE PROPIONATE 50 MCG/ACT NA SUSP: 2.0000 | Freq: Every day | NASAL | 6 refills | Status: DC

## 2019-06-05 MED ORDER — AMOXICILLIN-POT CLAVULANATE 875-125 MG PO TABS: 1.0000 | ORAL_TABLET | Freq: Two times a day (BID) | ORAL | 0 refills | Status: DC

## 2019-06-05 NOTE — Telephone Encounter (Signed)
One time courtesy since it is now Friday.  aug[facility]in and [facility]onase [facility]  Daily use of a probiotic advised for 3 weeks.

## 2019-06-05 NOTE — Telephone Encounter (Signed)
Patient notified and voiced understanding.

## 2019-06-05 NOTE — Telephone Encounter (Signed)
Patient reported these symptoms during his nurse visit. Patient wants to know if you can send in a cheaper nasal spray to the pharmacy.  Pt said that he was coughing up some green mucus and having some nasal bleeding.  Pt thinks he may have a sinus infection.

## 2020-08-03 ENCOUNTER — Telehealth: Payer: Self-pay | Admitting: Internal Medicine

## 2020-08-03 NOTE — Telephone Encounter (Signed)
error 

## 2020-08-30 ENCOUNTER — Other Ambulatory Visit (INDEPENDENT_AMBULATORY_CARE_PROVIDER_SITE_OTHER): Payer: BLUE CROSS/BLUE SHIELD

## 2020-08-30 ENCOUNTER — Encounter: Payer: Self-pay | Admitting: Family

## 2020-08-30 ENCOUNTER — Other Ambulatory Visit: Payer: Self-pay

## 2020-08-30 ENCOUNTER — Telehealth (INDEPENDENT_AMBULATORY_CARE_PROVIDER_SITE_OTHER): Payer: BLUE CROSS/BLUE SHIELD | Admitting: Family

## 2020-08-30 VITALS — Ht 70.98 in | Wt 175.0 lb

## 2020-08-30 DIAGNOSIS — J029 Acute pharyngitis, unspecified: Secondary | ICD-10-CM

## 2020-08-30 LAB — POCT RAPID STREP A (OFFICE): Rapid Strep A Screen: NEGATIVE

## 2020-08-30 MED ORDER — FLUTICASONE PROPIONATE 50 MCG/ACT NA SUSP: 2.0000 | Freq: Every day | NASAL | 6 refills | Status: DC

## 2020-08-30 NOTE — Assessment & Plan Note (Addendum)
Duration 1 day. Afebrile. Pending strep, influenza, covid, and RSV testing. Discussed with patient conservative management to start with very close vigilance of symptoms. He will start flonase and let me know how he is doing. We also discussed azelastine alternatively.

## 2020-08-30 NOTE — Progress Notes (Signed)
Virtual Visit via Video Note  I connected with@  on 08/30/20 at  9:00 AM EST by a video enabled telemedicine application and verified that I am speaking with the correct person using two identifiers.  Location patient: home Location provider:work  Persons participating in the virtual visit: patient, provider  I discussed the limitations of evaluation and management by telemedicine and the availability of in person appointments. The patient expressed understanding and agreed to proceed.  Interactive audio and video telecommunications were attempted between this provider and patient, however failed, approx 50% time spent on video , due to patient having technical difficulties or patient did not have access to video capability.  We continued and completed visit with audio only.   HPI:  Acute visit Complains of clear, thick nasal congestion, sore throat, body aches and fatigue x 1 day, unchanged. Throat is painful to swallowing. Not drooling, or trouble eating, choking. Thinks sore throat is from nasal drainage.  Negative covid home test today and yesterday  Endorses cough 'every now and then', low grade fever 99.15F. No sob, wheezing, ear pain.  Has tried cough drops, Sudafed. Hasnt been taking flonase.   No h/o lung disease.  Non smoker Fully vaccinated for covid H/o sinus surgery Has seen Dr Irving Shows ENT in the past.  No recent antibiotics.     ROS: See pertinent positives and negatives per HPI.    EXAM:  VITALS per patient if applicable: Ht 5' 10.98" (1.803 m)   Wt 175 lb (79.4 kg)   BMI 24.42 kg/m  BP Readings from Last 3 Encounters:  06/02/19 128/70  05/25/19 (!) 150/96  05/21/18 126/74   Wt Readings from Last 3 Encounters:  08/30/20 175 lb (79.4 kg)  05/25/19 173 lb 6.4 oz (78.7 kg)  05/21/18 176 lb 3.2 oz (79.9 kg)    GENERAL: alert, oriented, appears well and in no acute distress  HEENT: atraumatic, conjunttiva clear, no obvious abnormalities on inspection of  external nose and ears  NECK: normal movements of the head and neck  LUNGS: on inspection no signs of respiratory distress, breathing rate appears normal, no obvious gross SOB, gasping or wheezing  CV: no obvious cyanosis  MS: moves all visible extremities without noticeable abnormality  PSYCH/NEURO: pleasant and cooperative, no obvious depression or anxiety, speech and thought processing grossly intact  ASSESSMENT AND PLAN:  Discussed the following assessment and plan:  Problem List Items Addressed This Visit      Other   Sore throat    Duration 1 day. Afebrile. Pending strep, influenza, covid, and RSV testing. Discussed with patient conservative management to start with very close vigilance of symptoms. He will start flonase and let me know how he is doing. We also discussed azelastine alternatively.      Relevant Medications   fluticasone (FLONASE) 50 MCG/ACT nasal spray      -we discussed possible serious and likely etiologies, options for evaluation and workup, limitations of telemedicine visit vs in person visit, treatment, treatment risks and precautions. Pt prefers to treat via telemedicine empirically rather then risking or undertaking an in person visit at this moment.  .   I discussed the assessment and treatment plan with the patient. The patient was provided an opportunity to ask questions and all were answered. The patient agreed with the plan and demonstrated an understanding of the instructions.   The patient was advised to call back or seek an in-person evaluation if the symptoms worsen or if the condition fails to improve  as anticipated.  I have spent 9 minutes with a patient on video and then phone.       Rennie Plowman, FNP

## 2020-08-31 ENCOUNTER — Telehealth: Payer: Self-pay | Admitting: Family

## 2020-08-31 ENCOUNTER — Telehealth: Payer: Self-pay

## 2020-08-31 DIAGNOSIS — U071 COVID-19: Secondary | ICD-10-CM

## 2020-08-31 DIAGNOSIS — J029 Acute pharyngitis, unspecified: Secondary | ICD-10-CM

## 2020-08-31 LAB — COVID-19, FLU A+B AND RSV
Influenza A, NAA: NOT DETECTED
Influenza B, NAA: NOT DETECTED
RSV, NAA: NOT DETECTED
SARS-CoV-2, NAA: DETECTED — AB

## 2020-08-31 MED ORDER — AMOXICILLIN-POT CLAVULANATE 875-125 MG PO TABS: 1.0000 | ORAL_TABLET | Freq: Two times a day (BID) | ORAL | 0 refills | Status: AC

## 2020-08-31 NOTE — Telephone Encounter (Signed)
Pt's wife called in and asked to speak to Dr. Darrick Huntsman for another medication to help assist with Frank Perkins's current symptoms. They saw Rennie Plowman on 08/30/2020 and wanted to know if anything more could be sent in to help relieve tooth and sinus pain. Spoke to Dr. Darrick Huntsman in office and Dr. Darrick Huntsman declined as she has not seen Frank Perkins for the issue.

## 2020-08-31 NOTE — Telephone Encounter (Signed)
Spoke with wife, Frank Perkins whom has patient's phone at store today.  She was seen at urgent care at [facility] this morning Wife states husband is not  I advised wife of all the below.  Advised to quarantine for 10 days and very unlikely that augmentin ( which I sent earlier would be helpful) in viral setting. Advised she may pick up prescription  However reasonable to wait on starting. He is fully vaccinated, no comorbities, mild symptoms.  Advised I would refer him to [facility] at this juncture unless symptoms were to worsen. She will let us know how he is doing.     Utilize over the counter medications to treat symptoms.  Please start Mucinex DM to take at bedtime if he developss cough.     Please begin Vit C 1000 mg daily   Vitamin D3 4000 IU daily   Zinc 50 mg daily   Quercetin 250 mg -500 mg twice daily ; this an antioxidant which reduces inflammation. You can find it at places such as [employer] and [employer] however not limited to these stores.   Once you are better, you may STOP all the above supplements.   Rest, hydrate very well, eat healthy protein food, Tylenol or Advil as directed .    Please go to Acute Care if symptoms worsen but are not an emergency.      Seek  treatment in the ED if respiratory issues/distress develops or unrelieved chest pain. Or, if you become severely weak, dehydrated, confused.

## 2020-08-31 NOTE — Addendum Note (Signed)
Addended by: Allegra Grana on: 08/31/2020 12:53 PM   Modules accepted: Orders

## 2020-08-31 NOTE — Telephone Encounter (Signed)
Pt's wife called and asked if results are back from yesterday afternoon. I let her know that it may take a couple of days and she said she wants to speak to Dr Darrick Huntsman or Shanda Bumps. She would not disclose why or any more info. Please advise

## 2020-08-31 NOTE — Telephone Encounter (Signed)
Call pt and wife  Strep negative Awaiting viral test  Likely viral as we discussed during visit however sounds like symptoms worsening I have sent in augmentin ( antibiotic) Ensure to take probiotics while on antibiotics and also for 2 weeks after completion. It is important to re-colonize the gut with good bacteria and also to prevent any diarrheal infections associated with antibiotic use.

## 2020-09-01 MED ORDER — BENZONATATE 100 MG PO CAPS: 100.0000 mg | ORAL_CAPSULE | Freq: Three times a day (TID) | ORAL | 1 refills | Status: DC | PRN

## 2020-09-01 NOTE — Addendum Note (Signed)
Addended by: Allegra Grana on: 09/01/2020 09:46 PM   Modules accepted: Orders

## 2020-09-01 NOTE — Telephone Encounter (Signed)
Call pt Triage What symptoms does he have currently? Any sob? Reiterate sob, cp, he would need to go to ED or call 911.  For cough , I have sent in non drowsy tessalon perles. He may also take Mucinex ( plain ) to break up congestion if he has.

## 2020-09-01 NOTE — Telephone Encounter (Signed)
Pt's wife called and asked if pt can take cough medicine that is stronger than OTC TussinX but not something that will knock him out. She states that his cough is dry and aggravating him. She states that his cough has gotten worse. Please advise

## 2020-09-01 NOTE — Telephone Encounter (Signed)
Called and spoke to Frank Perkins's wife. She verbalized understanding and states that Frank Perkins has already called and spoke to the patient.

## 2020-09-02 NOTE — Telephone Encounter (Signed)
Spoken to wife trudy, patient is not having any SOB,or chest pain he is only having coughing fits. Wife was notified of the below.

## 2020-09-09 ENCOUNTER — Encounter: Payer: BLUE CROSS/BLUE SHIELD | Admitting: Internal Medicine

## 2020-10-21 ENCOUNTER — Ambulatory Visit (INDEPENDENT_AMBULATORY_CARE_PROVIDER_SITE_OTHER): Payer: BLUE CROSS/BLUE SHIELD | Admitting: Internal Medicine

## 2020-10-21 ENCOUNTER — Other Ambulatory Visit: Payer: Self-pay

## 2020-10-21 ENCOUNTER — Encounter: Payer: Self-pay | Admitting: Internal Medicine

## 2020-10-21 VITALS — BP 130/88 | HR 69 | Resp 15 | Ht 70.0 in | Wt 173.2 lb

## 2020-10-21 DIAGNOSIS — Z125 Encounter for screening for malignant neoplasm of prostate: Secondary | ICD-10-CM

## 2020-10-21 DIAGNOSIS — K219 Gastro-esophageal reflux disease without esophagitis: Secondary | ICD-10-CM

## 2020-10-21 DIAGNOSIS — E782 Mixed hyperlipidemia: Secondary | ICD-10-CM

## 2020-10-21 DIAGNOSIS — E538 Deficiency of other specified B group vitamins: Secondary | ICD-10-CM

## 2020-10-21 DIAGNOSIS — Z23 Encounter for immunization: Secondary | ICD-10-CM | POA: Diagnosis not present

## 2020-10-21 DIAGNOSIS — Z1211 Encounter for screening for malignant neoplasm of colon: Secondary | ICD-10-CM

## 2020-10-21 DIAGNOSIS — E559 Vitamin D deficiency, unspecified: Secondary | ICD-10-CM | POA: Diagnosis not present

## 2020-10-21 DIAGNOSIS — Z0000 Encounter for general adult medical examination without abnormal findings: Secondary | ICD-10-CM | POA: Diagnosis not present

## 2020-10-21 LAB — COMPREHENSIVE METABOLIC PANEL
ALT: 28 U/L (ref 0–53)
AST: 21 U/L (ref 0–37)
Albumin: 4.5 g/dL (ref 3.5–5.2)
Alkaline Phosphatase: 43 U/L (ref 39–117)
BUN: 12 mg/dL (ref 6–23)
CO2: 26 mEq/L (ref 19–32)
Calcium: 9.3 mg/dL (ref 8.4–10.5)
Chloride: 104 mEq/L (ref 96–112)
Creatinine, Ser: 0.93 mg/dL (ref 0.40–1.50)
GFR: 89.8 mL/min (ref >60.00)
Glucose, Bld: 102 mg/dL — ABNORMAL HIGH (ref 70–99)
Potassium: 4.2 mEq/L (ref 3.5–5.1)
Sodium: 138 mEq/L (ref 135–145)
Total Bilirubin: 1.2 mg/dL (ref 0.2–1.2)
Total Protein: 7 g/dL (ref 6.0–8.3)

## 2020-10-21 LAB — LIPID PANEL
Cholesterol: 194 mg/dL (ref 0–200)
HDL: 85.3 mg/dL
LDL Cholesterol: 92 mg/dL (ref 0–99)
NonHDL: 108.38
Total CHOL/HDL Ratio: 2
Triglycerides: 80 mg/dL (ref 0.0–149.0)
VLDL: 16 mg/dL (ref 0.0–40.0)

## 2020-10-21 LAB — VITAMIN B12: Vitamin B-12: 216 pg/mL (ref 211–911)

## 2020-10-21 LAB — PSA: PSA: 2.08 ng/mL (ref 0.10–4.00)

## 2020-10-21 LAB — VITAMIN D 25 HYDROXY (VIT D DEFICIENCY, FRACTURES): VITD: 38.11 ng/mL (ref 30.00–100.00)

## 2020-10-21 MED ORDER — TAMSULOSIN HCL 0.4 MG PO CAPS: ORAL_CAPSULE | ORAL | 3 refills | Status: DC

## 2020-10-21 NOTE — Progress Notes (Signed)
Patient ID: Frank Perkins, male    DOB: 02-15-1961  Age: 60 y.o. MRN: [id number]  The patient is here for annual wellness examination and management of other chronic and acute problems.  This visit occurred during the SARS-CoV-2 public health emergency.  Safety protocols were in place, including screening questions prior to the visit, additional usage of staff PPE, and extensive cleaning of exam room while observing appropriate contact time as indicated for disinfecting solutions.     The risk factors are reflected in the social history.  The roster of all physicians providing medical care to patient - is listed in the Snapshot section of the chart.  Activities of daily living:  The patient is 100% independent in all ADLs: dressing, toileting, feeding as well as independent mobility  Home safety : The patient has smoke detectors in the home. They wear seatbelts.  There are no firearms at home. There is no violence in the home.   There is no risks for hepatitis, STDs or HIV. There is no   history of blood transfusion. They have no travel history to infectious disease endemic areas of the world.  The patient has seen their dentist in the last six month. They have seen their eye doctor in the last year. They admit to slight hearing difficulty with regard to whispered voices and some television programs.  They have deferred audiologic testing in the last year.  They do not  have excessive sun exposure. Discussed the need for sun protection: hats, long sleeves and use of sunscreen if there is significant sun exposure.   Diet: the importance of a healthy diet is discussed. They do have a healthy diet.  The benefits of regular aerobic exercise were discussed. She walks 4 times per week ,  20 minutes.   Depression screen: there are no signs or vegative symptoms of depression- irritability, change in appetite, anhedonia, sadness/tearfullness.  The following portions of the patient's history were  reviewed and updated as appropriate: allergies, current medications, past family history, past medical history,  past surgical history, past social history  and problem list.  Visual acuity was not assessed per patient preference since she has regular follow up with her ophthalmologist. Hearing and body mass index were assessed and reviewed.   During the course of the visit the patient was educated and counseled about appropriate screening and preventive services including : fall prevention , diabetes screening, nutrition counseling, colorectal cancer screening, and recommended immunizations.    CC: The primary encounter diagnosis was Colon cancer screening. Diagnoses of Prostate cancer screening, Mixed hyperlipidemia, COVID-19 vaccine administered, Vitamin D deficiency, B12 deficiency, Routine general medical examination at a health care facility, and Gastroesophageal reflux disease without esophagitis were also pertinent to this visit.  MILD COVID INFECTION DIAGNOSED FEB 9 AFTER PRESENTING WITH PHARYNGITIS and cough.  Had been vaccinated with Moderna in August 2021  Sense of smell still not recovered   Some knee pain . Physically demanding job,  Research officer, [employer] on [employer] . 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  Overdue for colon ca screening  Normal scope in 2008  cologuard requested today   never saw dermatology for removal of skin tag under left arm/axillary area. \ Using nexium 20 for gerd symptoms   History Demondre has a past medical history of Bladder outlet obstruction, BPH (benign prostatic hyperplasia), ED (erectile dysfunction), GERD (gastroesophageal reflux disease), and Prostatitis.   He has a past surgical history that includes Nasal sinus surgery.   His  family history includes Cancer (age of onset: 27) in his father; Dementia in his father; Esophageal cancer in his father; Healthy in his brother, mother, and sister; Prostate cancer in his father.He reports that he has never smoked.  His smokeless tobacco use includes chew. He reports current alcohol use. He reports that he does not use drugs.  Outpatient Medications Prior to Visit  Medication Sig Dispense Refill  . benzonatate (TESSALON) 100 MG capsule Take 1 capsule (100 mg total) by mouth 3 (three) times daily as needed for cough. 20 capsule 1  . esomeprazole (NEXIUM) 20 MG capsule Take 20 mg by mouth daily at 12 noon.    . fluticasone (FLONASE) 50 MCG/ACT nasal spray Place 2 sprays into both nostrils daily. 16 g 6  . ipratropium (ATROVENT) 0.06 % nasal spray Place 2 sprays into both nostrils 4 (four) times daily. 15 mL 12  . sildenafil (REVATIO) 20 MG tablet Take 3 to 5 tablets two hours prior to intercourse on an empty stomach. 50 tablet 5  . tamsulosin (FLOMAX) 0.4 MG CAPS capsule TAKE ONE (1) CAPSULE EACH DAY. 90 capsule 3   No facility-administered medications prior to visit.    Review of Systems   Patient denies headache, fevers, malaise, unintentional weight loss, skin rash, eye pain, sinus congestion and sinus pain, sore throat, dysphagia,  hemoptysis , cough, dyspnea, wheezing, chest pain, palpitations, orthopnea, edema, abdominal pain, nausea, melena, diarrhea, constipation, flank pain, dysuria, hematuria, urinary  Frequency, nocturia, numbness, tingling, seizures,  Focal weakness, Loss of consciousness,  Tremor, insomnia, depression, anxiety, and suicidal ideation.      Objective:  BP 130/88 (BP Location: Left Arm, Patient Position: Sitting, Cuff Size: Normal)   Pulse 69   Resp 15   Ht 5\' 10"  (1.778 m)   Wt 173 lb 3.2 oz (78.6 kg)   SpO2 97%   BMI 24.85 kg/m   Physical Exam  General appearance: alert, cooperative and appears stated age Ears: normal TM's and external ear canals both ears Throat: lips, mucosa, and tongue normal; teeth and gums normal Neck: no adenopathy, no carotid bruit, supple, symmetrical, trachea midline and thyroid not enlarged, symmetric, no tenderness/mass/nodules Back:  symmetric, no curvature. ROM normal. No CVA tenderness. Lungs: clear to auscultation bilaterally Heart: regular rate and rhythm, S1, S2 normal, no murmur, click, rub or gallop Abdomen: soft, non-tender; bowel sounds normal; no masses,  no organomegaly Pulses: 2+ and symmetric Skin: Skin color, texture, turgor normal. No rashes or lesions Lymph nodes: Cervical, supraclavicular, and axillary nodes normal.  Assessment & Plan:   Problem List Items Addressed This Visit      Unprioritized   B12 deficiency    Likely due to PPI or alcohol,    But will check rbc folate and intrinsic factor ab test with first RN visit for B12 injections.        Relevant Orders   Vitamin B12 (Completed)   Intrinsic Factor Antibodies   Folate RBC   GERD (gastroesophageal reflux disease)    Managed with OTC  Omeprazole.   Discussed current controversy regarding continued prolonged use of PPI in patients without documented Barretts esophagus and recommend substituting H2 blocker whenever possible       Routine general medical examination at a health care facility    age appropriate education and counseling updated, referrals for preventative services and immunizations addressed, dietary and smoking counseling addressed, most recent labs reviewed.  I have personally reviewed and have noted:  1) the patient's medical and  social history 2) The pt's use of alcohol, tobacco, and illicit drugs 3) The patient's current medications and supplements 4) Functional ability including ADL's, fall risk, home safety risk, hearing and visual impairment 5) Diet and physical activities 6) Evidence for depression or mood disorder 7) The patient's height, weight, and BMI have been recorded in the chart  I have made referrals, and provided counseling and education based on review of the above       Other Visit Diagnoses    Colon cancer screening    -  Primary   Relevant Orders   Cologuard   Prostate cancer screening        Relevant Orders   PSA (Completed)   Mixed hyperlipidemia       Relevant Orders   Comprehensive metabolic panel (Completed)   Lipid panel (Completed)   COVID-19 vaccine administered       Relevant Orders   SARS-CoV-2 Semi-Quantitative Total Antibody, Spike   Vitamin D deficiency       Relevant Orders   VITAMIN D 25 Hydroxy (Vit-D Deficiency, Fractures) (Completed)      I am having Greggory Stallion A. Effie Shy "Alinda Money" maintain his esomeprazole, sildenafil, ipratropium, fluticasone, benzonatate, and tamsulosin.  Meds ordered this encounter  Medications  . tamsulosin (FLOMAX) 0.4 MG CAPS capsule    Sig: TAKE ONE (1) CAPSULE EACH DAY.    Dispense:  90 capsule    Refill:  3    Medications Discontinued During This Encounter  Medication Reason  . tamsulosin (FLOMAX) 0.4 MG CAPS capsule Reorder    Follow-up: Return in about 6 months (around 04/22/2021).   Sherlene Shams, MD

## 2020-10-21 NOTE — Patient Instructions (Addendum)
If your COVID antibody test is HIGH (>1000) ON TODAY'S LABS,   I WOULD POSTPONE YOUR BOOSTER FOR 3 MORE MONTHS  Mustard and/or dill pickle juice will PREVENT most foot cramps if taken prophylactically  TETANUS VACCINE GIVEN TODAY  The new "normal"  for  blood pressure is now 120/70, and YOU ARE ABOVE THAT TODAY  .  Please check your blood pressure a few times at home and send me the readings so I can determine if you need to start a medication to lower your blood pressure .  Return in 6 months     I will initiate the order for your colon cancer screening  Test, the one called  Cologuard.  It will be delivered to your house, and you will send off a stool sample in the envelope it provides.   Check with your insurance to make sure it is covered as a screening test   Health Maintenance, Male Adopting a healthy lifestyle and getting preventive care are important in promoting health and wellness. Ask your health care provider about:  The right schedule for you to have regular tests and exams.  Things you can do on your own to prevent diseases and keep yourself healthy. What should I know about diet, weight, and exercise? Eat a healthy diet  Eat a diet that includes plenty of vegetables, fruits, low-fat dairy products, and lean protein.  Do not eat a lot of foods that are high in solid fats, added sugars, or sodium.   Maintain a healthy weight Body mass index (BMI) is a measurement that can be used to identify possible weight problems. It estimates body fat based on height and weight. Your health care provider can help determine your BMI and help you achieve or maintain a healthy weight. Get regular exercise Get regular exercise. This is one of the most important things you can do for your health. Most adults should:  Exercise for at least 150 minutes each week. The exercise should increase your heart rate and make you sweat (moderate-intensity exercise).  Do strengthening exercises at  least twice a week. This is in addition to the moderate-intensity exercise.  Spend less time sitting. Even light physical activity can be beneficial. Watch cholesterol and blood lipids Have your blood tested for lipids and cholesterol at 60 years of age, then have this test every 5 years. You may need to have your cholesterol levels checked more often if:  Your lipid or cholesterol levels are high.  You are older than 60 years of age.  You are at high risk for heart disease. What should I know about cancer screening? Many types of cancers can be detected early and may often be prevented. Depending on your health history and family history, you may need to have cancer screening at various ages. This may include screening for:  Colorectal cancer.  Prostate cancer.  Skin cancer.  Lung cancer. What should I know about heart disease, diabetes, and high blood pressure? Blood pressure and heart disease  High blood pressure causes heart disease and increases the risk of stroke. This is more likely to develop in people who have high blood pressure readings, are of African descent, or are overweight.  Talk with your health care provider about your target blood pressure readings.  Have your blood pressure checked: ? Every 3-5 years if you are 73-59 years of age. ? Every year if you are 7 years old or older.  If you are between the ages of 30  and 75 and are a current or former smoker, ask your health care provider if you should have a one-time screening for abdominal aortic aneurysm (AAA). Diabetes Have regular diabetes screenings. This checks your fasting blood sugar level. Have the screening done:  Once every three years after age 42 if you are at a normal weight and have a low risk for diabetes.  More often and at a younger age if you are overweight or have a high risk for diabetes. What should I know about preventing infection? Hepatitis B If you have a higher risk for hepatitis B,  you should be screened for this virus. Talk with your health care provider to find out if you are at risk for hepatitis B infection. Hepatitis C Blood testing is recommended for:  Everyone born from [birth year] through 1965.  Anyone with known risk factors for hepatitis C. Sexually transmitted infections (STIs)  You should be screened each year for STIs, including gonorrhea and chlamydia, if: ? You are sexually active and are younger than 60 years of age. ? You are older than 60 years of age and your health care provider tells you that you are at risk for this type of infection. ? Your sexual activity has changed since you were last screened, and you are at increased risk for chlamydia or gonorrhea. Ask your health care provider if you are at risk.  Ask your health care provider about whether you are at high risk for HIV. Your health care provider may recommend a prescription medicine to help prevent HIV infection. If you choose to take medicine to prevent HIV, you should first get tested for HIV. You should then be tested every 3 months for as long as you are taking the medicine. Follow these instructions at home: Lifestyle  Do not use any products that contain nicotine or tobacco, such as cigarettes, e-cigarettes, and chewing tobacco. If you need help quitting, ask your health care provider.  Do not use street drugs.  Do not share needles.  Ask your health care provider for help if you need support or information about quitting drugs. Alcohol use  Do not drink alcohol if your health care provider tells you not to drink.  If you drink alcohol: ? Limit how much you have to 0-2 drinks a day. ? Be aware of how much alcohol is in your drink. In the [place] one drink equals one 12 oz bottle of beer (355 mL), one 5 oz glass of wine (148 mL), or one 1 oz glass of hard liquor (44 mL). General instructions  Schedule regular health, dental, and eye exams.  Stay current with your vaccines.  Tell  your health care provider if: ? You often feel depressed. ? You have ever been abused or do not feel safe at home. Summary  Adopting a healthy lifestyle and getting preventive care are important in promoting health and wellness.  Follow your health care provider's instructions about healthy diet, exercising, and getting tested or screened for diseases.  Follow your health care provider's instructions on monitoring your cholesterol and blood pressure. This information is not intended to replace advice given to you by your health care provider. Make sure you discuss any questions you have with your health care provider. Document Revised: 07/02/2018 Document Reviewed: 07/02/2018 Elsevier Patient Education  2021 [employer].

## 2020-10-23 DIAGNOSIS — K219 Gastro-esophageal reflux disease without esophagitis: Secondary | ICD-10-CM | POA: Insufficient documentation

## 2020-10-23 DIAGNOSIS — E538 Deficiency of other specified B group vitamins: Secondary | ICD-10-CM | POA: Insufficient documentation

## 2020-10-23 NOTE — Assessment & Plan Note (Signed)
Likely due to PPI or alcohol,    But will check rbc folate and intrinsic factor ab test with first RN visit for B12 injections.

## 2020-10-23 NOTE — Assessment & Plan Note (Signed)

## 2020-10-23 NOTE — Assessment & Plan Note (Signed)
Managed with OTC  Omeprazole.   Discussed current controversy regarding continued prolonged use of PPI in patients without documented Barretts esophagus and recommend substituting H2 blocker whenever possible

## 2020-10-28 LAB — SARS-COV-2 SEMI-QUANTITATIVE TOTAL ANTIBODY, SPIKE: SARS COV2 AB, Total Spike Semi QN: >2500 U/mL — ABNORMAL HIGH (ref <0.8)

## 2020-11-01 ENCOUNTER — Other Ambulatory Visit: Payer: Self-pay

## 2020-11-01 ENCOUNTER — Other Ambulatory Visit (INDEPENDENT_AMBULATORY_CARE_PROVIDER_SITE_OTHER): Payer: BLUE CROSS/BLUE SHIELD

## 2020-11-01 ENCOUNTER — Ambulatory Visit (INDEPENDENT_AMBULATORY_CARE_PROVIDER_SITE_OTHER): Payer: BLUE CROSS/BLUE SHIELD

## 2020-11-01 DIAGNOSIS — E538 Deficiency of other specified B group vitamins: Secondary | ICD-10-CM | POA: Diagnosis not present

## 2020-11-01 MED ORDER — CYANOCOBALAMIN 1000 MCG/ML IJ SOLN
1000.0000 ug | Freq: Once | INTRAMUSCULAR | Status: AC
  Administered 2020-11-01: 1000 ug via INTRAMUSCULAR

## 2020-11-01 NOTE — Progress Notes (Signed)
Patient presented for B 12 injection to right deltoid, patient voiced no concerns nor showed any signs of distress during injection. 

## 2020-11-02 ENCOUNTER — Telehealth: Payer: Self-pay | Admitting: Internal Medicine

## 2020-11-02 DIAGNOSIS — N529 Male erectile dysfunction, unspecified: Secondary | ICD-10-CM

## 2020-11-02 DIAGNOSIS — J029 Acute pharyngitis, unspecified: Secondary | ICD-10-CM

## 2020-11-02 NOTE — Telephone Encounter (Signed)
pt wife called and said that two rx were supposed to be sent in at his last visit  Sildenafil 20mg  and flonase

## 2020-11-03 MED ORDER — FLUTICASONE PROPIONATE 50 MCG/ACT NA SUSP: 2.0000 | Freq: Every day | NASAL | 6 refills | Status: DC

## 2020-11-03 MED ORDER — SILDENAFIL CITRATE 20 MG PO TABS: ORAL_TABLET | ORAL | 5 refills | Status: DC

## 2020-11-03 NOTE — Addendum Note (Signed)
Addended by: Sandy Salaam on: 11/03/2020 10:20 AM   Modules accepted: Orders

## 2020-11-03 NOTE — Telephone Encounter (Signed)
Medications have been refilled ?

## 2020-11-06 LAB — FOLATE RBC: RBC Folate: 659 ng/mL RBC (ref >280)

## 2020-11-06 LAB — INTRINSIC FACTOR ANTIBODIES: Intrinsic Factor: NEGATIVE

## 2020-11-11 ENCOUNTER — Other Ambulatory Visit: Payer: Self-pay

## 2020-11-11 ENCOUNTER — Ambulatory Visit (INDEPENDENT_AMBULATORY_CARE_PROVIDER_SITE_OTHER): Payer: BLUE CROSS/BLUE SHIELD

## 2020-11-11 VITALS — Temp 97.4°F

## 2020-11-11 DIAGNOSIS — E538 Deficiency of other specified B group vitamins: Secondary | ICD-10-CM

## 2020-11-11 MED ORDER — CYANOCOBALAMIN 1000 MCG/ML IJ SOLN
1000.0000 ug | Freq: Once | INTRAMUSCULAR | Status: AC
  Administered 2020-11-11: 1000 ug via INTRAMUSCULAR

## 2020-11-11 NOTE — Progress Notes (Signed)
Patient came in today for B-12 injection in left deltoid. Patient tolerated well.  

## 2020-12-03 LAB — COLOGUARD: Cologuard: NEGATIVE

## 2020-12-07 LAB — COLOGUARD

## 2020-12-22 LAB — COLOGUARD

## 2020-12-23 ENCOUNTER — Telehealth: Payer: Self-pay | Admitting: Internal Medicine

## 2020-12-25 ENCOUNTER — Encounter: Payer: Self-pay | Admitting: Internal Medicine

## 2020-12-25 LAB — COLOGUARD: Cologuard: NEGATIVE

## 2020-12-30 LAB — COLOGUARD: COLOGUARD: NEGATIVE

## 2021-01-18 NOTE — Telephone Encounter (Signed)
The results of patient's cologuard is negative and I have abstracted it.  Please notify patient

## 2021-04-26 ENCOUNTER — Ambulatory Visit: Payer: BLUE CROSS/BLUE SHIELD | Admitting: Internal Medicine

## 2021-10-03 ENCOUNTER — Other Ambulatory Visit: Payer: Self-pay

## 2021-10-03 ENCOUNTER — Ambulatory Visit (INDEPENDENT_AMBULATORY_CARE_PROVIDER_SITE_OTHER): Payer: Self-pay | Admitting: Internal Medicine

## 2021-10-03 ENCOUNTER — Encounter: Payer: Self-pay | Admitting: Internal Medicine

## 2021-10-03 VITALS — BP 114/70 | HR 60 | Temp 97.7°F | Ht 70.0 in | Wt 172.8 lb

## 2021-10-03 DIAGNOSIS — Z789 Other specified health status: Secondary | ICD-10-CM

## 2021-10-03 DIAGNOSIS — E538 Deficiency of other specified B group vitamins: Secondary | ICD-10-CM

## 2021-10-03 DIAGNOSIS — R519 Headache, unspecified: Secondary | ICD-10-CM

## 2021-10-03 DIAGNOSIS — F1090 Alcohol use, unspecified, uncomplicated: Secondary | ICD-10-CM

## 2021-10-03 LAB — C-REACTIVE PROTEIN: CRP: <1 mg/dL (ref 0.5–20.0)

## 2021-10-03 LAB — COMPREHENSIVE METABOLIC PANEL
ALT: 36 U/L (ref 0–53)
AST: 27 U/L (ref 0–37)
Albumin: 4.3 g/dL (ref 3.5–5.2)
Alkaline Phosphatase: 38 U/L — ABNORMAL LOW (ref 39–117)
BUN: 17 mg/dL (ref 6–23)
CO2: 25 mEq/L (ref 19–32)
Calcium: 8.8 mg/dL (ref 8.4–10.5)
Chloride: 106 mEq/L (ref 96–112)
Creatinine, Ser: 1.18 mg/dL (ref 0.40–1.50)
GFR: 67.04 mL/min (ref >60.00)
Glucose, Bld: 100 mg/dL — ABNORMAL HIGH (ref 70–99)
Potassium: 4.3 mEq/L (ref 3.5–5.1)
Sodium: 138 mEq/L (ref 135–145)
Total Bilirubin: 0.6 mg/dL (ref 0.2–1.2)
Total Protein: 6.5 g/dL (ref 6.0–8.3)

## 2021-10-03 LAB — SEDIMENTATION RATE: Sed Rate: 11 mm/hr (ref 0–20)

## 2021-10-03 LAB — B12 AND FOLATE PANEL
Folate: 14.5 ng/mL (ref >5.9)
Vitamin B-12: 287 pg/mL (ref 211–911)

## 2021-10-03 LAB — POC COVID19 BINAXNOW: SARS Coronavirus 2 Ag: NEGATIVE

## 2021-10-03 NOTE — Assessment & Plan Note (Signed)
Has not been supplementing since last year's IM injections.  rechceking today  ?

## 2021-10-03 NOTE — Patient Instructions (Signed)
Stop using Viagra until this headache syndrome is sorted out ? ?I will be orering an MRI Brain vs MRA depending on the results of your labs today ? ?You can use aleve and tylenol as needed for the headache ? ?

## 2021-10-03 NOTE — Assessment & Plan Note (Signed)
New onset,  With the most severe headache occurring after use of Viagra.   Recurrent.,  Without fevers,  Nausea and photophobia.  Improving with NSAIDs.  Checking ESR,CRP,  Will need MRI/MRA brain given recurrence without use of viagra to rule out aneurysm  ?

## 2021-10-03 NOTE — Progress Notes (Signed)
? ?Subjective:  ?Patient ID: Frank Perkins, male    DOB: 1960/10/10  Age: 61 y.o. MRN: [id number] ? ?CC: The primary encounter diagnosis was Acute nonintractable headache, unspecified headache type. Diagnoses of B12 deficiency and Alcohol use were also pertinent to this visit. ? ? ?This visit occurred during the SARS-CoV-2 public health emergency.  Safety protocols were in place, including screening questions prior to the visit, additional usage of staff PPE, and extensive cleaning of exam room while observing appropriate contact time as indicated for disinfecting solutions.   ? ?HPI ?Frank Perkins presents for evaluation and treatment of recurrent severe occipital headache ?Chief Complaint  ?Patient presents with  ? Headache  ? ?Headache started while vacationing in [place].  Started after he [place] the [place] after taking a viagra.   Resolved with rest.  Denies heavy alcohol use (one cocktail on the plane to [place]).  Took a Viagra the following day and headache recurred the second day, spent the day in bed.  Vision felt blurry but not double . No photophobia, fevers or  nausea .  Scalp was tender on the occipital area .  Described as severe,  throbbing  Started in the occipital region and spread to the frontal region.  None since the 2nd day in [place],  returned home 2 days ago,  headache returned  today  in the occipital area several hours after waking up.     No dizziness or vertigo,  but has untreated b12 deficiency.  ? ?BP checked by wife during headache 160/100.  Normal  here in office.  Took Aleve today and headache has improved.  ? ?Last headache this severe was in his 20's/.  No interim history of migraines or chronic headaches.  ? ? ?Outpatient Medications Prior to Visit  ?Medication Sig Dispense Refill  ? esomeprazole (NEXIUM) 20 MG capsule Take 20 mg by mouth daily at 12 noon.    ? fluticasone (FLONASE) 50 MCG/ACT nasal spray Place 2 sprays into both nostrils daily. 16 g 6  ? ipratropium  (ATROVENT) 0.06 % nasal spray Place 2 sprays into both nostrils 4 (four) times daily. 15 mL 12  ? sildenafil (REVATIO) 20 MG tablet Take 3 to 5 tablets two hours prior to intercourse on an empty stomach. 50 tablet 5  ? tamsulosin (FLOMAX) 0.4 MG CAPS capsule TAKE ONE (1) CAPSULE EACH DAY. 90 capsule 3  ? benzonatate (TESSALON) 100 MG capsule Take 1 capsule (100 mg total) by mouth 3 (three) times daily as needed for cough. (Patient not taking: Reported on 10/03/2021) 20 capsule 1  ? ?No facility-administered medications prior to visit.  ? ? ?Review of Systems; ? ?Patient denies headache, fevers, malaise, unintentional weight loss, skin rash, eye pain, sinus congestion and sinus pain, sore throat, dysphagia,  hemoptysis , cough, dyspnea, wheezing, chest pain, palpitations, orthopnea, edema, abdominal pain, nausea, melena, diarrhea, constipation, flank pain, dysuria, hematuria, urinary  Frequency, nocturia, numbness, tingling, seizures,  Focal weakness, Loss of consciousness,  Tremor, insomnia, depression, anxiety, and suicidal ideation.   ? ? ? ?Objective:  ?BP 114/70 (BP Location: Left Arm, Patient Position: Sitting, Cuff Size: Large)   Pulse 60   Temp 97.7 ?F (36.5 ?C) (Oral)   Ht 5' 10"  (1.778 m)   Wt 172 lb 12.8 oz (78.4 kg)   SpO2 98%   BMI 24.79 kg/m?  ? ?BP Readings from Last 3 Encounters:  ?10/03/21 114/70  ?10/21/20 130/88  ?06/02/19 128/70  ? ? ?Wt Readings from  Last 3 Encounters:  ?10/03/21 172 lb 12.8 oz (78.4 kg)  ?10/21/20 173 lb 3.2 oz (78.6 kg)  ?08/30/20 175 lb (79.4 kg)  ? ? ?General appearance: alert, cooperative and appears stated age ?Ears: normal TM's and external ear canals both ears ?Throat: lips, mucosa, and tongue normal; teeth and gums normal ?Neck: no adenopathy, no carotid bruit, supple, symmetrical, trachea midline and thyroid not enlarged, symmetric, no tenderness/mass/nodules ?Back: symmetric, no curvature. ROM normal. No CVA tenderness. ?Lungs: clear to auscultation  bilaterally ?Heart: regular rate and rhythm, S1, S2 normal, no murmur, click, rub or gallop ?Abdomen: soft, non-tender; bowel sounds normal; no masses,  no organomegaly ?Pulses: 2+ and symmetric ?Skin: Skin color, texture, turgor normal. No rashes or lesions ?Lymph nodes: Cervical, supraclavicular, and axillary nodes normal. ? ?No results found for: HGBA1C ? ?Lab Results  ?Component Value Date  ? CREATININE 0.93 10/21/2020  ? CREATININE 0.92 06/02/2019  ? CREATININE 0.97 05/21/2018  ? ? ?Lab Results  ?Component Value Date  ? WBC 4.2 06/02/2019  ? HGB 15.6 06/02/2019  ? HCT 45.0 06/02/2019  ? PLT 209.0 06/02/2019  ? GLUCOSE 102 (H) 10/21/2020  ? CHOL 194 10/21/2020  ? TRIG 80.0 10/21/2020  ? HDL 85.30 10/21/2020  ? LDLDIRECT 67.4 10/24/2012  ? [place] 92 10/21/2020  ? ALT 28 10/21/2020  ? AST 21 10/21/2020  ? NA 138 10/21/2020  ? K 4.2 10/21/2020  ? CL 104 10/21/2020  ? CREATININE 0.93 10/21/2020  ? BUN 12 10/21/2020  ? CO2 26 10/21/2020  ? TSH 1.18 06/02/2019  ? PSA 2.08 10/21/2020  ? MICROALBUR <0.7 06/02/2019  ? ? ?Assessment & Plan:  ? ?Problem List Items Addressed This Visit   ? ? Alcohol use  ? Relevant Orders  ? Comprehensive metabolic panel  ? B12 deficiency  ?  Has not been supplementing since last year's IM injections.  rechceking today  ?  ?  ? Relevant Orders  ? B12 and Folate Panel  ? Acute nonintractable headache - Primary  ?  New onset,  With the most severe headache occurring after use of Viagra.   Recurrent.,  Without fevers,  Nausea and photophobia.  Improving with NSAIDs.  Checking ESR,CRP,  Will need MRI/MRA brain given recurrence without use of viagra to rule out aneurysm  ?  ?  ? Relevant Orders  ? POC COVID-19 (Completed)  ? C-reactive protein  ? Sedimentation rate  ? ? ?I spent 30 minutes dedicated to the care of this patient on the date of this encounter to include pre-visit review of patient's medical history,  most recent imaging studies, Face-to-face time with the patient , and post visit  ordering of testing and therapeutics.   ? ?Follow-up: No follow-ups on file. ? ? ?Crecencio Mc, MD ?

## 2021-10-06 ENCOUNTER — Ambulatory Visit (INDEPENDENT_AMBULATORY_CARE_PROVIDER_SITE_OTHER): Payer: BLUE CROSS/BLUE SHIELD

## 2021-10-06 ENCOUNTER — Other Ambulatory Visit: Payer: Self-pay

## 2021-10-06 DIAGNOSIS — E538 Deficiency of other specified B group vitamins: Secondary | ICD-10-CM

## 2021-10-06 MED ORDER — CYANOCOBALAMIN 1000 MCG/ML IJ SOLN
1000.0000 ug | Freq: Once | INTRAMUSCULAR | Status: AC
  Administered 2021-10-06: 1000 ug via INTRAMUSCULAR

## 2021-10-06 NOTE — Progress Notes (Signed)
Pt arrived for 1/4 B12 injection, given in L deltoid. Pt tolerated injection well, showed no signs of distress nor voice any concerns.  ?

## 2021-10-12 ENCOUNTER — Ambulatory Visit (INDEPENDENT_AMBULATORY_CARE_PROVIDER_SITE_OTHER): Payer: BLUE CROSS/BLUE SHIELD

## 2021-10-12 ENCOUNTER — Other Ambulatory Visit: Payer: Self-pay

## 2021-10-12 DIAGNOSIS — E538 Deficiency of other specified B group vitamins: Secondary | ICD-10-CM

## 2021-10-12 MED ORDER — CYANOCOBALAMIN 1000 MCG/ML IJ SOLN
1000.0000 ug | Freq: Once | INTRAMUSCULAR | Status: AC
  Administered 2021-10-12: 1000 ug via INTRAMUSCULAR

## 2021-10-12 NOTE — Progress Notes (Signed)
Pt arrived for 2/4 B12 injection, given in R deltoid. Pt tolerated injection well showed no signs of distress nor voiced any concerns.  ?

## 2021-10-18 ENCOUNTER — Ambulatory Visit: Payer: BLUE CROSS/BLUE SHIELD

## 2021-10-25 ENCOUNTER — Ambulatory Visit (INDEPENDENT_AMBULATORY_CARE_PROVIDER_SITE_OTHER): Payer: Self-pay | Admitting: *Deleted

## 2021-10-25 ENCOUNTER — Telehealth: Payer: Self-pay

## 2021-10-25 DIAGNOSIS — E538 Deficiency of other specified B group vitamins: Secondary | ICD-10-CM

## 2021-10-25 MED ORDER — CYANOCOBALAMIN 1000 MCG/ML IJ SOLN
1000.0000 ug | Freq: Once | INTRAMUSCULAR | Status: AC
  Administered 2021-10-25: 1000 ug via INTRAMUSCULAR

## 2021-10-25 NOTE — Telephone Encounter (Signed)
error 

## 2021-10-25 NOTE — Progress Notes (Signed)
Pt arrived for 3/4 B12 injection. Pt tolerated well. No signs or complaints of distress. ?

## 2021-11-01 ENCOUNTER — Ambulatory Visit (INDEPENDENT_AMBULATORY_CARE_PROVIDER_SITE_OTHER): Payer: Self-pay

## 2021-11-01 DIAGNOSIS — E538 Deficiency of other specified B group vitamins: Secondary | ICD-10-CM

## 2021-11-01 MED ORDER — CYANOCOBALAMIN 1000 MCG/ML IJ SOLN
1000.0000 ug | Freq: Once | INTRAMUSCULAR | Status: AC
  Administered 2021-11-01: 1000 ug via INTRAMUSCULAR

## 2021-11-01 NOTE — Progress Notes (Signed)
Patient presented for B 12 injection to left deltoid, patient voiced no concerns nor showed any signs of distress during injection. 

## 2021-11-01 NOTE — Progress Notes (Signed)
Pt arrived for 3/4 B12 injection. Pt tolerated well. No signs or complaints of distress. ?

## 2021-11-10 ENCOUNTER — Other Ambulatory Visit: Payer: Self-pay | Admitting: Internal Medicine

## 2021-12-04 ENCOUNTER — Telehealth: Payer: Self-pay | Admitting: Internal Medicine

## 2021-12-04 NOTE — Telephone Encounter (Signed)
Pt spouse called stating the pt has a sinus infection periodically and want something called in. I told spouse the pt would have to make an appt to be seen. Pt spouse stated could I just send the message back and see what they say. I told her I would ?

## 2021-12-04 NOTE — Telephone Encounter (Signed)
LMTCB with pt's wife. Need to let wife know that Dr. Darrick Huntsman will not prescribe antibiotics with out an appt. If willing to schedule the appt can be a virtual visit if that works best for him.  ?

## 2021-12-04 NOTE — Telephone Encounter (Signed)
Spoke with pt's wife and she stated that Saturday the pt started with sinus congestion and today when she had him to test for covid because his throat is now sore after swabbing his nose the swab had blood on it. Wife stated that the pt gets sinus infections all the time and has had sinus surgery in the past. She would like to know if an antibiotic could be called in.  ?

## 2021-12-06 ENCOUNTER — Encounter: Payer: Self-pay | Admitting: Internal Medicine

## 2021-12-06 ENCOUNTER — Telehealth (INDEPENDENT_AMBULATORY_CARE_PROVIDER_SITE_OTHER): Payer: Self-pay | Admitting: Internal Medicine

## 2021-12-06 DIAGNOSIS — J0101 Acute recurrent maxillary sinusitis: Secondary | ICD-10-CM

## 2021-12-06 MED ORDER — AMOXICILLIN-POT CLAVULANATE 875-125 MG PO TABS: 1.0000 | ORAL_TABLET | Freq: Two times a day (BID) | ORAL | 0 refills | Status: DC

## 2021-12-06 MED ORDER — PREDNISONE 10 MG PO TABS: ORAL_TABLET | ORAL | 0 refills | Status: DC

## 2021-12-06 NOTE — Assessment & Plan Note (Signed)
Secondary to prolonged congestion from viral infection.  augmentin and prednisone taper sent to pharmacy.  Decongestants,  Cough suppressants and saline gargles recommended.  Daily use of a probiotic advised for 3 weeks.  ?

## 2021-12-06 NOTE — Progress Notes (Signed)
Virtual Visit via Caregility Note ? ?This visit type was conducted due to national recommendations for restrictions regarding the COVID-19 pandemic (e.g. social distancing).  This format is felt to be most appropriate for this patient at this time.  All issues noted in this document were discussed and addressed.  No physical exam was performed (except for noted visual exam findings with Video Visits).  ? ?I connected withNAME@ on 12/06/21 at  3:30 PM EDT by a video enabled telemedicine application and verified that I am speaking with the correct person using two identifiers. ?Location patient: home ?Location provider: work or home office ?Persons participating in the virtual visit: patient, provider ? ?I discussed the limitations, risks, security and privacy concerns of performing an evaluation and management service by telephone and the availability of in person appointments. I also discussed with the patient that there may be a patient responsible charge related to this service. The patient expressed understanding and agreed to proceed. ? ? ?Reason for visit: bloody sinus drainage , congestion  ? ?HPI: ? ? 61 yr old male  with history of sinus surgery presents with 5 day history of pharyngitis, sinus congestion, cough and purulent rhinorrhea which as become blood streaked from the right nasal passage.  COVID NEGATIVE . No pleurisy or dyspnea .  No headache or body aches. taking mucinex D  ? ? ?ROS: See pertinent positives and negatives per HPI. ? ?Past Medical History:  ?Diagnosis Date  ? Bladder outlet obstruction   ? BPH (benign prostatic hyperplasia)   ? ED (erectile dysfunction)   ? GERD (gastroesophageal reflux disease)   ? Prostatitis   ? ? ?Past Surgical History:  ?Procedure Laterality Date  ? NASAL SINUS SURGERY    ? ? ?Family History  ?Problem Relation Age of Onset  ? Cancer Father 10  ?     prostate Ca and throat ca   ? Prostate cancer Father   ? Dementia Father   ?     died of dementia complications   ?  Esophageal cancer Father   ? Healthy Mother   ? Healthy Sister   ? Healthy Brother   ? Bladder Cancer Neg Hx   ? Kidney cancer Neg Hx   ? Kidney disease Neg Hx   ? Sickle cell trait Neg Hx   ? Tuberculosis Neg Hx   ? ? ?SOCIAL HX:  reports that he has never smoked. His smokeless tobacco use includes chew. He reports current alcohol use. He reports that he does not use drugs.  ? ? ?Current Outpatient Medications:  ?  amoxicillin-clavulanate (AUGMENTIN) 875-125 MG tablet, Take 1 tablet by mouth 2 (two) times daily., Disp: 14 tablet, Rfl: 0 ?  esomeprazole (NEXIUM) 20 MG capsule, Take 20 mg by mouth daily at 12 noon., Disp: , Rfl:  ?  ipratropium (ATROVENT) 0.06 % nasal spray, Place 2 sprays into both nostrils 4 (four) times daily., Disp: 15 mL, Rfl: 12 ?  predniSONE (DELTASONE) 10 MG tablet, 6 tablets on Day 1 , then reduce by 1 tablet daily until gone, Disp: 21 tablet, Rfl: 0 ?  sildenafil (REVATIO) 20 MG tablet, Take 3 to 5 tablets two hours prior to intercourse on an empty stomach., Disp: 50 tablet, Rfl: 5 ?  tamsulosin (FLOMAX) 0.4 MG CAPS capsule, TAKE 1 CAPSULE BY MOUTH DAILY, Disp: 90 capsule, Rfl: 3 ?  fluticasone (FLONASE) 50 MCG/ACT nasal spray, Place 2 sprays into both nostrils daily. (Patient not taking: Reported on 12/06/2021), Disp: 16 g,  Rfl: 6 ? ?EXAM: ? ?VITALS per patient if applicable: ? ?GENERAL: alert, oriented, appears well and in no acute distress ? ?HEENT: atraumatic, conjunttiva clear, no obvious abnormalities on inspection of external nose and ears ? ?NECK: normal movements of the head and neck ? ?LUNGS: on inspection no signs of respiratory distress, breathing rate appears normal, no obvious gross SOB, gasping or wheezing ? ?CV: no obvious cyanosis ? ?MS: moves all visible extremities without noticeable abnormality ? ?PSYCH/NEURO: pleasant and cooperative, no obvious depression or anxiety, speech and thought processing grossly intact ? ?ASSESSMENT AND PLAN: ? ?Discussed the following  assessment and plan: ? ?Acute recurrent maxillary sinusitis ? ?Sinusitis, acute maxillary ?Secondary to prolonged congestion from viral infection.  augmentin and prednisone taper sent to pharmacy.  Decongestants,  Cough suppressants and saline gargles recommended.  Daily use of a probiotic advised for 3 weeks.  ? ?  ?I discussed the assessment and treatment plan with the patient. The patient was provided an opportunity to ask questions and all were answered. The patient agreed with the plan and demonstrated an understanding of the instructions. ?  ?The patient was advised to call back or seek an in-person evaluation if the symptoms worsen or if the condition fails to improve as anticipated. ? ? ?I spent 20 minutes dedicated to the care of this patient on the date of this encounter to include pre-visit review of his medical history,  including his sinus surgery , Face-to-face time with the patient , and post visit ordering of testing and therapeutics.  ? ? ?Sherlene Shams, MD   ?

## 2021-12-06 NOTE — Telephone Encounter (Signed)
Pt is scheduled for an appt with Dr. Darrick Huntsman this afternoon.  ?

## 2021-12-06 NOTE — Patient Instructions (Signed)
?  I am treating you for sinusitis  which is a complication from your viral infection or your allergies  due to  persistent sinus congestion. ? ? I am prescribing an antibiotic (augmentin ) and a prednisone taper  To manage the infection and the inflammation in your ear/sinuses.  ? ?I also advise use of the following OTC meds to help with your other symptoms.  ? ?I recommend that you Switch from claritin to allegra or zyrtec as your antihistamine ? ?Pseudoephedrine and phenylephrine are both oral decongestants. They can cause increased pulse,  increased blood pressure and insomnia IN SOME PEOPLE.   You can buy them separately or in combination with antihistamine or as part of a "cold and sinus " syrup or tablet. ? ?you may substitute Afrin nasal spray if you do not tolerate oral decongestants.  It is a 12 hour nasal spray.  You should not use for more than 5 days in a row  (or you will get rebound congestion when you stop using it).  You can use it once daily at night for a longer period of time  instead of the oral decongestants  If needed to prevent insomnia. ? ?Guaifenesin is a "MUCOLYTIC"  (THINS OUT THE SNOT)  you need to add Dextromethorphan as your   COUGH SUPPRESSANT  ? ?Gargle with salt water as needed for sore throat ?  ?

## 2022-01-02 ENCOUNTER — Telehealth: Payer: Self-pay | Admitting: Internal Medicine

## 2022-01-02 NOTE — Telephone Encounter (Signed)
Pt wife request for another antibiotic to be called in because the infection is still present

## 2022-01-02 NOTE — Telephone Encounter (Signed)
error 

## 2022-01-03 ENCOUNTER — Other Ambulatory Visit: Payer: Self-pay | Admitting: Internal Medicine

## 2022-01-03 NOTE — Telephone Encounter (Signed)
Pt has been scheduled for tomorrow with Dr. French Ana.

## 2022-01-04 ENCOUNTER — Telehealth (INDEPENDENT_AMBULATORY_CARE_PROVIDER_SITE_OTHER): Payer: Self-pay | Admitting: Internal Medicine

## 2022-01-04 ENCOUNTER — Encounter: Payer: Self-pay | Admitting: Internal Medicine

## 2022-01-04 DIAGNOSIS — J9801 Acute bronchospasm: Secondary | ICD-10-CM

## 2022-01-04 DIAGNOSIS — R0982 Postnasal drip: Secondary | ICD-10-CM

## 2022-01-04 DIAGNOSIS — R059 Cough, unspecified: Secondary | ICD-10-CM

## 2022-01-04 DIAGNOSIS — R509 Fever, unspecified: Secondary | ICD-10-CM

## 2022-01-04 DIAGNOSIS — J309 Allergic rhinitis, unspecified: Secondary | ICD-10-CM

## 2022-01-04 DIAGNOSIS — J0111 Acute recurrent frontal sinusitis: Secondary | ICD-10-CM

## 2022-01-04 MED ORDER — ALBUTEROL SULFATE HFA 108 (90 BASE) MCG/ACT IN AERS: 1.0000 | INHALATION_SPRAY | Freq: Four times a day (QID) | RESPIRATORY_TRACT | 0 refills | Status: DC | PRN

## 2022-01-04 MED ORDER — LEVOFLOXACIN 750 MG PO TABS: 750.0000 mg | ORAL_TABLET | Freq: Every day | ORAL | 0 refills | Status: DC

## 2022-01-04 MED ORDER — CETIRIZINE HCL 10 MG PO TABS: 10.0000 mg | ORAL_TABLET | Freq: Every evening | ORAL | 3 refills | Status: DC | PRN

## 2022-01-04 MED ORDER — PREDNISONE 10 MG PO TABS: ORAL_TABLET | ORAL | 0 refills | Status: DC

## 2022-01-04 MED ORDER — SALINE SPRAY 0.65 % NA SOLN: 2.0000 | NASAL | 11 refills | Status: DC | PRN

## 2022-01-04 MED ORDER — HYDROCOD POLI-CHLORPHE POLI ER 10-8 MG/5ML PO SUER: 5.0000 mL | Freq: Every evening | ORAL | 0 refills | Status: DC | PRN

## 2022-01-04 NOTE — Progress Notes (Signed)
Virtual Visit via Video Note  I connected with Frank Perkins  on 01/04/22 at  3:15 PM EDT by a video enabled telemedicine application and verified that I am speaking with the correct person using two identifiers.  Location patient: [place] Location provider:work or home office Persons participating in the virtual visit: patient, provider  I discussed the limitations and requested verbal permission for telemedicine visit. The patient expressed understanding and agreed to proceed.   HPI:  Acute telemedicine visit for : Pt c/o cough with yellow phelgm &PND, drainage down throat, blowing out yellow green mucus ongoing for 2 days. Taking mucinex w/o relief, has slight fever and wheezing. Home covid test 01/03/22 was negative.  He has flonase atrovent last month 11/2021 given augmentin, prednisone  He has had sinus surgery x 1 est Dr. Elenore Rota tried to call for appt but per wife nothing x 2 weeks  He has had blood in his nose as well Tried mucinex otc w/o relief  Had temp 99.5   Denies wheezing   -Pertinent past medical history: see below -Pertinent medication allergies:No Known Allergies -COVID-19 vaccine status:  Immunization History  Administered Date(s) Administered   Influenza,inj,Quad PF,6+ Mos 05/21/2018, 05/25/2019   Moderna Sars-Covid-2 Vaccination 02/26/2020, 03/20/2020   Td 10/25/1999, 10/21/2020   Tdap 10/24/2009     ROS: See pertinent positives and negatives per HPI.  Past Medical History:  Diagnosis Date   Bladder outlet obstruction    BPH (benign prostatic hyperplasia)    ED (erectile dysfunction)    GERD (gastroesophageal reflux disease)    Prostatitis     Past Surgical History:  Procedure Laterality Date   NASAL SINUS SURGERY       Current Outpatient Medications:    albuterol (VENTOLIN HFA) 108 (90 Base) MCG/ACT inhaler, Inhale 1-2 puffs into the lungs every 6 (six) hours as needed for wheezing or shortness of breath., Disp: 18 g, Rfl: 0   cetirizine (ZYRTEC)  10 MG tablet, Take 1 tablet (10 mg total) by mouth at bedtime as needed for allergies., Disp: 90 tablet, Rfl: 3   chlorpheniramine-HYDROcodone (TUSSIONEX PENNKINETIC ER) 10-8 MG/5ML, Take 5 mLs by mouth at bedtime as needed., Disp: 115 mL, Rfl: 0   esomeprazole (NEXIUM) 20 MG capsule, Take 20 mg by mouth daily at 12 noon., Disp: , Rfl:    fluticasone (FLONASE) 50 MCG/ACT nasal spray, Place 2 sprays into both nostrils daily., Disp: 16 g, Rfl: 6   ipratropium (ATROVENT) 0.06 % nasal spray, Place 2 sprays into both nostrils 4 (four) times daily., Disp: 15 mL, Rfl: 12   levofloxacin (LEVAQUIN) 750 MG tablet, Take 1 tablet (750 mg total) by mouth daily. With food, Disp: 7 tablet, Rfl: 0   sildenafil (REVATIO) 20 MG tablet, Take 3 to 5 tablets two hours prior to intercourse on an empty stomach., Disp: 50 tablet, Rfl: 5   sodium chloride (OCEAN) 0.65 % SOLN nasal spray, Place 2 sprays into both nostrils as needed for congestion., Disp: 30 mL, Rfl: 11   tamsulosin (FLOMAX) 0.4 MG CAPS capsule, TAKE 1 CAPSULE BY MOUTH DAILY, Disp: 90 capsule, Rfl: 3   predniSONE (DELTASONE) 10 MG tablet, 6 tablets on Day 1 , then reduce by 1 tablet daily until gone in am with food, Disp: 21 tablet, Rfl: 0  EXAM:  VITALS per patient if applicable:  GENERAL: alert, oriented, appears well and in no acute distress  HEENT: atraumatic, conjunttiva clear, no obvious abnormalities on inspection of external nose and ears  NECK: normal  movements of the head and neck  LUNGS: on inspection no signs of respiratory distress, breathing rate appears normal, no obvious gross SOB, gasping or wheezing  CV: no obvious cyanosis  MS: moves all visible extremities without noticeable abnormality  PSYCH/NEURO: pleasant and cooperative, no obvious depression or anxiety, speech and thought processing grossly intact  ASSESSMENT AND PLAN:  Discussed the following assessment and plan:  Acute recurrent frontal sinusitis s/p sinus surgery x  1 est ent Dr. Elenore Rota - Plan: levofloxacin (LEVAQUIN) 750 MG tablet, sodium chloride (OCEAN) 0.65 % SOLN nasal spray, cetirizine (ZYRTEC) 10 MG tablet Still call ent Dr. Delfin Edis for an appt   Cough, unspecified type - Plan: DG Chest 2 View, chlorpheniramine-HYDROcodone (TUSSIONEX PENNKINETIC ER) 10-8 MG/5ML, albuterol (VENTOLIN HFA) 108 (90 Base) MCG/ACT inhaler  Fever, unspecified fever cause - Plan: DG Chest 2 View Prn tylenol   PND (post-nasal drip) - Plan: DG Chest 2 View Ns, atrovent hold flonase now due to nose bleeding   Allergic rhinitis, unspecified seasonality, unspecified trigger - Plan: cetirizine (ZYRTEC) 10 MG tablet  Bronchospasm - Plan: albuterol (VENTOLIN HFA) 108 (90 Base) MCG/ACT inhaler  -we discussed possible serious and likely etiologies, options for evaluation and workup, limitations of telemedicine visit vs in person visit, treatment, treatment risks and precautions. Pt is agreeable to treatment via telemedicine at this moment.     I discussed the assessment and treatment plan with the patient. The patient was provided an opportunity to ask questions and all were answered. The patient agreed with the plan and demonstrated an understanding of the instructions.    Time spent 20 minutes Bevelyn Buckles, MD

## 2022-01-11 ENCOUNTER — Other Ambulatory Visit: Payer: Self-pay | Admitting: Internal Medicine

## 2022-01-11 DIAGNOSIS — N529 Male erectile dysfunction, unspecified: Secondary | ICD-10-CM

## 2022-04-12 ENCOUNTER — Encounter: Payer: Self-pay | Admitting: Internal Medicine

## 2022-04-13 ENCOUNTER — Ambulatory Visit (INDEPENDENT_AMBULATORY_CARE_PROVIDER_SITE_OTHER): Payer: BLUE CROSS/BLUE SHIELD | Admitting: Internal Medicine

## 2022-04-13 DIAGNOSIS — R7301 Impaired fasting glucose: Secondary | ICD-10-CM

## 2022-04-13 DIAGNOSIS — R5383 Other fatigue: Secondary | ICD-10-CM

## 2022-04-13 DIAGNOSIS — E782 Mixed hyperlipidemia: Secondary | ICD-10-CM

## 2022-04-13 DIAGNOSIS — Z125 Encounter for screening for malignant neoplasm of prostate: Secondary | ICD-10-CM

## 2022-04-27 NOTE — Progress Notes (Signed)
Patient failed to keep scheduled appointment and will be charged a no show fee.   

## 2022-07-10 ENCOUNTER — Encounter: Payer: Self-pay | Admitting: Family Medicine

## 2022-07-10 ENCOUNTER — Telehealth (INDEPENDENT_AMBULATORY_CARE_PROVIDER_SITE_OTHER): Payer: Self-pay | Admitting: Family Medicine

## 2022-07-10 VITALS — Ht 70.0 in | Wt 172.0 lb

## 2022-07-10 DIAGNOSIS — J0100 Acute maxillary sinusitis, unspecified: Secondary | ICD-10-CM

## 2022-07-10 MED ORDER — AMOXICILLIN-POT CLAVULANATE 875-125 MG PO TABS: 1.0000 | ORAL_TABLET | Freq: Two times a day (BID) | ORAL | 0 refills | Status: DC

## 2022-07-10 NOTE — Progress Notes (Signed)
Virtual Visit via video Note  This visit type was conducted due to national recommendations for restrictions regarding the COVID-19 pandemic (e.g. social distancing).  This format is felt to be most appropriate for this patient at this time.  All issues noted in this document were discussed and addressed.  No physical exam was performed (except for noted visual exam findings with Video Visits).   I connected with Frank Perkins today at 10:00 AM EST by a video enabled telemedicine application and verified that I am speaking with the correct person using two identifiers. Location patient: home Location provider: truck Persons participating in the virtual visit: patient, provider  I discussed the limitations, risks, security and privacy concerns of performing an evaluation and management service by telephone and the availability of in person appointments. I also discussed with the patient that there may be a patient responsible charge related to this service. The patient expressed understanding and agreed to proceed.  Reason for visit: same day visit  HPI: Sinus congestion: Patient notes onset of symptoms 8 to 9 days ago.  He has had postnasal drip and some sore throat with sinus pressure and congestion.  He is blowing yellow/green/bloody mucus out of his nose.  He is coughing up some mucus though has had no hemoptysis.  He had some fevers initially though none over the last several days.  No change to chronic breathing issues.  No taste or smell disturbances.  He does note some body aches in the afternoon.  He notes he took a COVID test a few days into his illness and the COVID test was negative.  He notes no COVID or flu exposure.  He has been taking over-the-counter cough suppressant at night which has been beneficial.  He has also been taking Mucinex and Sudafed.  Patient tries to limit the Sudafed given that it causes his blood pressure to go up.   ROS: See pertinent positives and negatives  per HPI.  Past Medical History:  Diagnosis Date   Bladder outlet obstruction    BPH (benign prostatic hyperplasia)    ED (erectile dysfunction)    GERD (gastroesophageal reflux disease)    Prostatitis     Past Surgical History:  Procedure Laterality Date   NASAL SINUS SURGERY     est ent Dr. Elenore Rota    Family History  Problem Relation Age of Onset   Cancer Father 64       prostate Ca and throat ca    Prostate cancer Father    Dementia Father        died of dementia complications    Esophageal cancer Father    Healthy Mother    Healthy Sister    Healthy Brother    Bladder Cancer Neg Hx    Kidney cancer Neg Hx    Kidney disease Neg Hx    Sickle cell trait Neg Hx    Tuberculosis Neg Hx     SOCIAL HX: Non-smoker   Current Outpatient Medications:    albuterol (VENTOLIN HFA) 108 (90 Base) MCG/ACT inhaler, Inhale 1-2 puffs into the lungs every 6 (six) hours as needed for wheezing or shortness of breath., Disp: 18 g, Rfl: 0   amoxicillin-clavulanate (AUGMENTIN) 875-125 MG tablet, Take 1 tablet by mouth 2 (two) times daily., Disp: 14 tablet, Rfl: 0   cetirizine (ZYRTEC) 10 MG tablet, Take 1 tablet (10 mg total) by mouth at bedtime as needed for allergies., Disp: 90 tablet, Rfl: 3   chlorpheniramine-HYDROcodone (TUSSIONEX PENNKINETIC ER)  10-8 MG/5ML, Take 5 mLs by mouth at bedtime as needed., Disp: 115 mL, Rfl: 0   esomeprazole (NEXIUM) 20 MG capsule, Take 20 mg by mouth daily at 12 noon., Disp: , Rfl:    fluticasone (FLONASE) 50 MCG/ACT nasal spray, Place 2 sprays into both nostrils daily., Disp: 16 g, Rfl: 6   ipratropium (ATROVENT) 0.06 % nasal spray, Place 2 sprays into both nostrils 4 (four) times daily., Disp: 15 mL, Rfl: 12   levofloxacin (LEVAQUIN) 750 MG tablet, Take 1 tablet (750 mg total) by mouth daily. With food, Disp: 7 tablet, Rfl: 0   predniSONE (DELTASONE) 10 MG tablet, 6 tablets on Day 1 , then reduce by 1 tablet daily until gone in am with food, Disp: 21  tablet, Rfl: 0   sildenafil (REVATIO) 20 MG tablet, TAKE 3 TO 5 TABLETS TWO HOURS PRIOR TO INTERCOURSE ON AN EMPTY STOMACH., Disp: 50 tablet, Rfl: 5   sodium chloride (OCEAN) 0.65 % SOLN nasal spray, Place 2 sprays into both nostrils as needed for congestion., Disp: 30 mL, Rfl: 11   tamsulosin (FLOMAX) 0.4 MG CAPS capsule, TAKE 1 CAPSULE BY MOUTH DAILY, Disp: 90 capsule, Rfl: 3  EXAM:  VITALS per patient if applicable:  GENERAL: alert, oriented, appears well and in no acute distress  HEENT: atraumatic, conjunttiva clear, no obvious abnormalities on inspection of external nose and ears  NECK: normal movements of the head and neck  LUNGS: on inspection no signs of respiratory distress, breathing rate appears normal, no obvious gross SOB, gasping or wheezing  CV: no obvious cyanosis  MS: moves all visible extremities without noticeable abnormality  PSYCH/NEURO: pleasant and cooperative, no obvious depression or anxiety, speech and thought processing grossly intact  ASSESSMENT AND PLAN:  Discussed the following assessment and plan:  Problem List Items Addressed This Visit     Sinusitis, acute maxillary - Primary    Patient has sinusitis.  Will treat with Augmentin 1 tablet twice daily for 7 days.  Discussed minimizing Sudafed use.  He can continue over-the-counter cough suppressant as it has been beneficial.  Discussed the risk of diarrhea with the Augmentin.  If he develops excessively watery stools he will let us know.  Discussed his symptoms will likely start to improve in 2 to 3 days on the Augmentin.  If his symptoms are not improving by Friday he will let us know.  If he has any worsening symptoms he will contact us.      Relevant Medications   amoxicillin-clavulanate (AUGMENTIN) 875-125 MG tablet    Return if symptoms worsen or fail to improve.   I discussed the assessment and treatment plan with the patient. The patient was provided an opportunity to ask questions and all  were answered. The patient agreed with the plan and demonstrated an understanding of the instructions.   The patient was advised to call back or seek an in-person evaluation if the symptoms worsen or if the condition fails to improve as anticipated.  Marikay Alar, MD

## 2022-07-10 NOTE — Assessment & Plan Note (Signed)
Patient has sinusitis.  Will treat with Augmentin 1 tablet twice daily for 7 days.  Discussed minimizing Sudafed use.  He can continue over-the-counter cough suppressant as it has been beneficial.  Discussed the risk of diarrhea with the Augmentin.  If he develops excessively watery stools he will let us know.  Discussed his symptoms will likely start to improve in 2 to 3 days on the Augmentin.  If his symptoms are not improving by Friday he will let us know.  If he has any worsening symptoms he will contact us.

## 2022-07-20 ENCOUNTER — Telehealth: Payer: Self-pay

## 2022-07-20 NOTE — Telephone Encounter (Signed)
Patient's wife, Arath Kaigler, called to state patient had an appointment with Dr. Marikay Alar on 07/10/2022 and was prescribed antibiotics.  Damian Leavell states patient has completed antibiotics and is still getting green mucus from nose and with cough.  Damian Leavell states patient would like to know if we can give him another round of antibiotics.  [facility] in [place] is patient's preferred pharmacy.

## 2022-07-20 NOTE — Telephone Encounter (Signed)
I called the patient and informed her that the provider is not here and will not be back until next week, I also informed her that another provider would not send in antibiotics without seeing the patient.  She stated that she would tell him to go to UC.  Cashton Hosley,cma

## 2022-07-25 ENCOUNTER — Ambulatory Visit (INDEPENDENT_AMBULATORY_CARE_PROVIDER_SITE_OTHER): Payer: Self-pay | Admitting: Family Medicine

## 2022-07-25 ENCOUNTER — Encounter: Payer: Self-pay | Admitting: Family Medicine

## 2022-07-25 VITALS — BP 118/74 | HR 79 | Temp 97.8°F | Ht 70.0 in | Wt 172.6 lb

## 2022-07-25 DIAGNOSIS — J0100 Acute maxillary sinusitis, unspecified: Secondary | ICD-10-CM

## 2022-07-25 DIAGNOSIS — J0111 Acute recurrent frontal sinusitis: Secondary | ICD-10-CM

## 2022-07-25 DIAGNOSIS — J029 Acute pharyngitis, unspecified: Secondary | ICD-10-CM

## 2022-07-25 MED ORDER — SALINE SPRAY 0.65 % NA SOLN: 2.0000 | NASAL | 2 refills | Status: AC | PRN

## 2022-07-25 MED ORDER — FLUTICASONE PROPIONATE 50 MCG/ACT NA SUSP: 2.0000 | Freq: Every day | NASAL | 2 refills | Status: AC

## 2022-07-25 MED ORDER — PREDNISONE 20 MG PO TABS: 20.0000 mg | ORAL_TABLET | Freq: Every day | ORAL | 0 refills | Status: AC

## 2022-07-25 MED ORDER — BENZONATATE 100 MG PO CAPS: 200.0000 mg | ORAL_CAPSULE | Freq: Two times a day (BID) | ORAL | 0 refills | Status: DC | PRN

## 2022-07-25 MED ORDER — IPRATROPIUM BROMIDE 0.06 % NA SOLN: 2.0000 | Freq: Four times a day (QID) | NASAL | 2 refills | Status: AC

## 2022-07-25 MED ORDER — FEXOFENADINE HCL 180 MG PO TABS: 180.0000 mg | ORAL_TABLET | Freq: Every day | ORAL | 2 refills | Status: DC

## 2022-07-25 MED ORDER — DOXYCYCLINE HYCLATE 100 MG PO TABS: 100.0000 mg | ORAL_TABLET | Freq: Two times a day (BID) | ORAL | 0 refills | Status: AC

## 2022-07-25 NOTE — Progress Notes (Signed)
SUBJECTIVE:   Chief Complaint  Patient presents with   Cough    Cough Green nose mucus   HPI  Sinusitis: Patient presents with sinusitis congestion and cough x 3 weeks. The patient reports chronic sinus infections for several years.  His symptoms include nasal congestion, sinus pressure, clear rhinorrhea, cough, mild shortness of breath with exertion and cough, wheezing.  Endorses cough with yellow sputum initially that resolved after treatment with antibiotics.  Cough is persistent and worse more at night and in the morning.  There has not been a history of fevers, sore throats.  Prior antibiotic therapy has included Augmentin. Other medications have included Flonase, Ipratropium Nasal spray but has not been using medications.  Has not had flu vaccine.  Endorses wife with similar symptoms.   PERTINENT PMH / PSH: GERD   OBJECTIVE:  BP 118/74   Pulse 79   Temp 97.8 F (36.6 C)   Ht 5\' 10"  (1.778 m)   Wt 172 lb 9.6 oz (78.3 kg)   SpO2 98%   BMI 24.77 kg/m    Physical Exam Vitals reviewed.  Constitutional:      General: He is not in acute distress.    Appearance: Normal appearance. He is not ill-appearing, toxic-appearing or diaphoretic.  HENT:     Right Ear: Tympanic membrane, ear canal and external ear normal. There is no impacted cerumen.     Left Ear: Tympanic membrane, ear canal and external ear normal. There is no impacted cerumen.     Nose: Congestion present. No rhinorrhea.     Mouth/Throat:     Mouth: Mucous membranes are moist.     Pharynx: Oropharynx is clear. No oropharyngeal exudate or posterior oropharyngeal erythema.  Eyes:     General:        Right eye: No discharge.        Left eye: No discharge.     Conjunctiva/sclera: Conjunctivae normal.  Cardiovascular:     Rate and Rhythm: Normal rate and regular rhythm.     Heart sounds: Normal heart sounds.  Pulmonary:     Effort: Pulmonary effort is normal.     Breath sounds: Normal breath sounds.   Musculoskeletal:        General: Normal range of motion.  Lymphadenopathy:     Cervical: No cervical adenopathy.  Skin:    General: Skin is warm and dry.  Neurological:     General: No focal deficit present.     Mental Status: He is alert and oriented to person, place, and time. Mental status is at baseline.  Psychiatric:        Mood and Affect: Mood normal.        Behavior: Behavior normal.        Thought Content: Thought content normal.        Judgment: Judgment normal.     ASSESSMENT/PLAN:  Acute non-recurrent maxillary sinusitis Assessment & Plan: Acute on  chronic.  Initially resolved after 7-day course of Augmentin.  However symptoms have returned and not controlled with Sudafed.  Has not been taking prescribed Flonase, Atrovent nasal spray. Start doxycycline 100 mg twice daily x 5 days Start prednisone 20 mg daily x 5 days Start probiotics daily while on antibiotics and for 2 weeks after completion of antibiotics to prevent diarrheal infections Start Allegra 180 mg daily Take Tessalon Perles 200 mg twice daily as needed for cough.  Be sure to keep these away from small children as they are harmful. Use honey  tea for cough Continue Flonase 1 spray twice daily Continue ipratropium spray 2 sprays 4 times daily Continue nasal saline sprays as needed Follow-up with PCP if no improvement.   Orders: -     Fluticasone Propionate; Place 2 sprays into both nostrils daily.  Dispense: 16 g; Refill: 2 -     Ipratropium Bromide; Place 2 sprays into both nostrils 4 (four) times daily.  Dispense: 15 mL; Refill: 2 -     Saline Spray; Place 2 sprays into both nostrils as needed for congestion.  Dispense: 30 mL; Refill: 2 -     Fexofenadine HCl; Take 1 tablet (180 mg total) by mouth daily.  Dispense: 30 tablet; Refill: 2 -     predniSONE; Take 1 tablet (20 mg total) by mouth daily with breakfast for 5 days.  Dispense: 5 tablet; Refill: 0 -     Benzonatate; Take 2 capsules (200 mg total)  by mouth 2 (two) times daily as needed for cough.  Dispense: 20 capsule; Refill: 0 -     Doxycycline Hyclate; Take 1 tablet (100 mg total) by mouth 2 (two) times daily for 5 days.  Dispense: 10 tablet; Refill: 0   PDMP reviewed  Return if symptoms worsen or fail to improve, for PCP.  Carollee Leitz, MD

## 2022-07-25 NOTE — Patient Instructions (Addendum)
It was a pleasure meeting you today. Thank you for allowing me to take part in your health care.  Our goals for today as we discussed include:  Start doxycycline 100 g 2 times daily x 5 days Start prednisone 20 mg daily x 5 days Take probiotics daily while on antibiotics and for 2 weeks after completion of antibiotics to prevent diarrheal infections Start Allegra 180 mg daily Take Tessalon Perles 200 mg twice daily as needed for cough.  Be sure to keep these away from small children as they are harmful. Use honey tea for cough Continue Flonase 1 spray twice daily Continue ipratropium spray 2 sprays 4 times daily Continue nasal saline sprays as needed  Follow-up with PCP if no improvement   If you have any questions or concerns, please do not hesitate to call the office at [phone].  I look forward to our next visit and until then take care and stay safe.  Regards,   Carollee Leitz, MD   [facility]

## 2022-08-04 ENCOUNTER — Encounter: Payer: Self-pay | Admitting: Family Medicine

## 2022-08-04 NOTE — Assessment & Plan Note (Deleted)
Acute on  chronic.  Initially resolved after 7-day course of Augmentin.  However symptoms have returned and not controlled with Sudafed.  Has not been taking prescribed Flonase, Atrovent nasal spray. Start doxycycline 100 mg twice daily x 5 days Start prednisone 20 mg daily x 5 days Start probiotics daily while on antibiotics and for 2 weeks after completion of antibiotics to prevent diarrheal infections Start Allegra 180 mg daily Take Tessalon Perles 200 mg twice daily as needed for cough.  Be sure to keep these away from small children as they are harmful. Use honey tea for cough Continue Flonase 1 spray twice daily Continue ipratropium spray 2 sprays 4 times daily Continue nasal saline sprays as needed Follow-up with PCP if no improvement.

## 2022-08-04 NOTE — Assessment & Plan Note (Addendum)
Acute on  chronic.  Initially resolved after 7-day course of Augmentin.  However symptoms have returned and not controlled with Sudafed.  Has not been taking prescribed Flonase, Atrovent nasal spray. Start doxycycline 100 mg twice daily x 5 days Start prednisone 20 mg daily x 5 days Start probiotics daily while on antibiotics and for 2 weeks after completion of antibiotics to prevent diarrheal infections Start Allegra 180 mg daily Take Tessalon Perles 200 mg twice daily as needed for cough.  Be sure to keep these away from small children as they are harmful. Use honey tea for cough Continue Flonase 1 spray twice daily Continue ipratropium spray 2 sprays 4 times daily Continue nasal saline sprays as needed Follow-up with PCP if no improvement. 

## 2022-09-19 ENCOUNTER — Other Ambulatory Visit: Payer: Self-pay | Admitting: Internal Medicine

## 2022-12-07 ENCOUNTER — Telehealth: Payer: Self-pay

## 2022-12-07 NOTE — Telephone Encounter (Signed)
Pt picked up patient assistance Ozempic. 

## 2023-04-02 ENCOUNTER — Other Ambulatory Visit: Payer: Self-pay | Admitting: Family

## 2023-04-02 DIAGNOSIS — N529 Male erectile dysfunction, unspecified: Secondary | ICD-10-CM

## 2023-05-07 ENCOUNTER — Ambulatory Visit: Payer: Self-pay | Admitting: Internal Medicine

## 2023-05-23 ENCOUNTER — Encounter: Payer: Self-pay | Admitting: Internal Medicine

## 2023-05-23 ENCOUNTER — Ambulatory Visit: Payer: Self-pay | Admitting: Internal Medicine

## 2023-05-23 VITALS — BP 120/78 | HR 88 | Ht 70.0 in | Wt 163.2 lb

## 2023-05-23 DIAGNOSIS — Z125 Encounter for screening for malignant neoplasm of prostate: Secondary | ICD-10-CM

## 2023-05-23 DIAGNOSIS — R5383 Other fatigue: Secondary | ICD-10-CM

## 2023-05-23 DIAGNOSIS — L989 Disorder of the skin and subcutaneous tissue, unspecified: Secondary | ICD-10-CM

## 2023-05-23 DIAGNOSIS — Z23 Encounter for immunization: Secondary | ICD-10-CM

## 2023-05-23 DIAGNOSIS — R634 Abnormal weight loss: Secondary | ICD-10-CM

## 2023-05-23 DIAGNOSIS — K625 Hemorrhage of anus and rectum: Secondary | ICD-10-CM

## 2023-05-23 DIAGNOSIS — Z0001 Encounter for general adult medical examination with abnormal findings: Secondary | ICD-10-CM

## 2023-05-23 DIAGNOSIS — D485 Neoplasm of uncertain behavior of skin: Secondary | ICD-10-CM

## 2023-05-23 DIAGNOSIS — Z0000 Encounter for general adult medical examination without abnormal findings: Secondary | ICD-10-CM

## 2023-05-23 DIAGNOSIS — R7301 Impaired fasting glucose: Secondary | ICD-10-CM

## 2023-05-23 DIAGNOSIS — E782 Mixed hyperlipidemia: Secondary | ICD-10-CM

## 2023-05-23 NOTE — Patient Instructions (Addendum)
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

## 2023-05-23 NOTE — Progress Notes (Deleted)
Subjective:  Patient ID: Frank Perkins, male    DOB: [birth year]  Age: 62 y.o. MRN: [id number]  CC: The primary encounter diagnosis was Prostate cancer screening. Diagnoses of Mixed hyperlipidemia, Impaired fasting glucose, and Other fatigue were also pertinent to this visit.   HPI Frank Perkins presents for  Chief Complaint  Patient presents with  . spot on face     62 YR OLD MALE WITH  NO SIGNIFICANT PMH PRESENTS WITH SEVERAL NEW FACIAL LESIONS THAT HAVE PRESENT SINCE  LAST YEAR  AND HAVE BECOME CONCERNING FOR  NO PRIOR DERM EVALUATION  USES BAR SOAP   WANTS TO SEE BENITAS GRAHAM   ATE 5 HURS AGO  VIRAL ILLNESS 3 WEEKS AGO .  TREATED WITH STEROIDS AND AUGMENTIN BY URGENT CARE .   NO PROBIOTICS   COUGH FOR 10-12 WEEKS FOLLOWING A PREVIOUS INFECTION .  CHEST X RAY DONE 3 WEEKS AGO,  NO PNEUMONIA .  HAS BEEN TREATING GERD AND ALLERGIC RHINITIS FOR YEARS.    UNINTENTIONAL  WEIGHT LOSS OF 9 LBS SINCE LAST YEAR     Outpatient Medications Prior to Visit  Medication Sig Dispense Refill  . esomeprazole (NEXIUM) 20 MG capsule Take 20 mg by mouth daily at 12 noon.    . fluticasone (FLONASE) 50 MCG/ACT nasal spray Place 2 sprays into both nostrils daily. 16 g 2  . ipratropium (ATROVENT) 0.06 % nasal spray Place 2 sprays into both nostrils 4 (four) times daily. 15 mL 2  . sildenafil (REVATIO) 20 MG tablet TAKE 3 TO 5 TABLETS TWO HOURS PRIOR TO INTERCOURSE ON AN EMPTY STOMACH. 50 tablet 5  . sodium chloride (OCEAN) 0.65 % SOLN nasal spray Place 2 sprays into both nostrils as needed for congestion. 30 mL 2  . tamsulosin (FLOMAX) 0.4 MG CAPS capsule TAKE 1 CAPSULE BY MOUTH EVERY DAY 90 capsule 3  . benzonatate (TESSALON) 100 MG capsule Take 2 capsules (200 mg total) by mouth 2 (two) times daily as needed for cough. (Patient not taking: Reported on 05/23/2023) 20 capsule 0  . fexofenadine (ALLEGRA ALLERGY) 180 MG tablet Take 1 tablet (180 mg total) by mouth daily. 30 tablet 2   No  facility-administered medications prior to visit.    Review of Systems;  Patient denies headache, fevers, malaise, unintentional weight loss, skin rash, eye pain, sinus congestion and sinus pain, sore throat, dysphagia,  hemoptysis , cough, dyspnea, wheezing, chest pain, palpitations, orthopnea, edema, abdominal pain, nausea, melena, diarrhea, constipation, flank pain, dysuria, hematuria, urinary  Frequency, nocturia, numbness, tingling, seizures,  Focal weakness, Loss of consciousness,  Tremor, insomnia, depression, anxiety, and suicidal ideation.      Objective:  BP 120/78   Pulse 88   Ht 5\' 10"  (1.778 m)   Wt 163 lb 3.2 oz (74 kg)   SpO2 97%   BMI 23.42 kg/m   BP Readings from Last 3 Encounters:  05/23/23 120/78  07/25/22 118/74  10/03/21 114/70    Wt Readings from Last 3 Encounters:  05/23/23 163 lb 3.2 oz (74 kg)  07/25/22 172 lb 9.6 oz (78.3 kg)  07/10/22 172 lb (78 kg)    Physical Exam  No results found for: "HGBA1C"  Lab Results  Component Value Date   CREATININE 1.18 10/03/2021   CREATININE 0.93 10/21/2020   CREATININE 0.92 06/02/2019    Lab Results  Component Value Date   WBC 4.2 06/02/2019   HGB 15.6 06/02/2019   HCT 45.0 06/02/2019   PLT 209.0  06/02/2019   GLUCOSE 100 (H) 10/03/2021   CHOL 194 10/21/2020   TRIG 80.0 10/21/2020   HDL 85.30 10/21/2020   LDLDIRECT 67.4 10/24/2012   LDLCALC 92 10/21/2020   ALT 36 10/03/2021   AST 27 10/03/2021   NA 138 10/03/2021   K 4.3 10/03/2021   CL 106 10/03/2021   CREATININE 1.18 10/03/2021   BUN 17 10/03/2021   CO2 25 10/03/2021   TSH 1.18 06/02/2019   PSA 2.08 10/21/2020   MICROALBUR <0.7 06/02/2019    DG Chest 2 View  Result Date: 04/23/2011 **** PRIOR REPORT IMPORTED FROM AN EXTERNAL SYSTEM **** PRIOR REPORT IMPORTED FROM THE SYNGO WORKFLOW SYSTEM REASON FOR EXAM:    chest pain COMMENTS:   LMP: (Male) PROCEDURE:     DXR - DXR CHEST PA (OR AP) AND LATERAL  - Apr 23 2011  5:19PM RESULT:      Comparison is made to the study of 06/29/2009. The lungs are clear. The heart and pulmonary vessels are normal. The bony and mediastinal structures are unremarkable. There is no effusion. There is no pneumothorax or evidence of congestive failure. IMPRESSION:      No acute cardiopulmonary disease.     Assessment & Plan:  .Prostate cancer screening  Mixed hyperlipidemia  Impaired fasting glucose  Other fatigue     I provided 30 minutes of face-to-face time during this encounter reviewing patient's last visit with me, patient's  most recent visit with cardiology,  nephrology,  and neurology,  recent surgical and non surgical procedures, previous  labs and imaging studies, counseling on currently addressed issues,  and post visit ordering to diagnostics and therapeutics .   Follow-up: No follow-ups on file.   Sherlene Shams, MD

## 2023-05-25 NOTE — Assessment & Plan Note (Signed)

## 2023-05-25 NOTE — Progress Notes (Signed)
Patient ID: Frank Perkins, male    DOB: Aug 23, 1960  Age: 62 y.o. MRN: [id number]  The patient is here for annual preventive examination and management of other chronic and acute problems.   The risk factors are reflected in the social history.   The roster of all physicians providing medical care to patient - is listed in the Snapshot section of the chart.   Activities of daily living:  The patient is 100% independent in all ADLs: dressing, toileting, feeding as well as independent mobility   Home safety : The patient has smoke detectors in the home. They wear seatbelts.  There are no unsecured firearms at home. There is no violence in the home.    There is no risks for hepatitis, STDs or HIV. There is no   history of blood transfusion. They have no travel history to infectious disease endemic areas of the world.   The patient has seen their dentist in the last six month. They have seen their eye doctor in the last year. The patinet  denies slight hearing difficulty with regard to whispered voices and some television programs.  They have deferred audiologic testing in the last year.  They do not  have excessive sun exposure. Discussed the need for sun protection: hats, long sleeves and use of sunscreen if there is significant sun exposure.    Diet: the importance of a healthy diet is discussed. They do have a healthy diet.   The benefits of regular aerobic exercise were discussed. The patient  exercises  3 to 5 days per week  for  60 minutes.    Depression screen: there are no signs or vegative symptoms of depression- irritability, change in appetite, anhedonia, sadness/tearfullness.   The following portions of the patient's history were reviewed and updated as appropriate: allergies, current medications, past family history, past medical history,  past surgical history, past social history  and problem list.   Visual acuity was not assessed per patient preference since the patient has  regular follow up with an  ophthalmologist. Hearing and body mass index were assessed and reviewed.    During the course of the visit the patient was educated and counseled about appropriate screening and preventive services including : fall prevention , diabetes screening, nutrition counseling, colorectal cancer screening, and recommended immunizations.    Chief Complaint:   SEVERAL NEW FACIAL LESIONS THAT HAVE PRESENT SINCE  LAST YEAR  AND HAVE BECOME CONCERNING FOR  NO PRIOR DERM EVALUATION  USES BAR SOAP   WANTS TO SEE [facility]   ATE 5 HoURS AGO  VIRAL ILLNESS 3 WEEKS AGO .  TREATED WITH STEROIDS AND AUGMENTIN BY URGENT CARE .   NO PROBIOTICS   COUGH FOR 10-12 WEEKS FOLLOWING A PREVIOUS INFECTION .  CHEST X RAY DONE 3 WEEKS AGO,  NO PNEUMONIA .  HAS BEEN TREATING GERD AND ALLERGIC RHINITIS FOR YEARS.    UNINTENTIONAL  WEIGHT LOSS OF 9 LBS SINCE LAST YEAR     Review of Symptoms  Patient denies headache, fevers, malaise, unintentional weight loss, skin rash, eye pain, sinus congestion and sinus pain, sore throat, dysphagia,  hemoptysis , cough, dyspnea, wheezing, chest pain, palpitations, orthopnea, edema, abdominal pain, nausea, melena, diarrhea, constipation, flank pain, dysuria, hematuria, urinary  Frequency, nocturia, numbness, tingling, seizures,  Focal weakness, Loss of consciousness,  Tremor, insomnia, depression, anxiety, and suicidal ideation.    Physical Exam:  BP 120/78   Pulse 88   Ht 5\' 10"  (1.778 m)  Wt 163 lb 3.2 oz (74 kg)   SpO2 97%   BMI 23.42 kg/m    Physical Exam Vitals reviewed.  Constitutional:      General: He is not in acute distress.    Appearance: Normal appearance. He is normal weight. He is not ill-appearing, toxic-appearing or diaphoretic.  HENT:     Head: Normocephalic and atraumatic.     Right Ear: Tympanic membrane, ear canal and external ear normal. There is no impacted cerumen.     Left Ear: Tympanic membrane, ear canal and  external ear normal. There is no impacted cerumen.     Nose: Nose normal.     Mouth/Throat:     Mouth: Mucous membranes are moist.     Pharynx: Oropharynx is clear.  Eyes:     General: No scleral icterus.       Right eye: No discharge.        Left eye: No discharge.     Conjunctiva/sclera: Conjunctivae normal.  Neck:     Thyroid: No thyromegaly.     Vascular: No carotid bruit or JVD.  Cardiovascular:     Rate and Rhythm: Normal rate and regular rhythm.     Heart sounds: Normal heart sounds.  Pulmonary:     Effort: Pulmonary effort is normal. No respiratory distress.     Breath sounds: Normal breath sounds.  Abdominal:     General: Bowel sounds are normal.     Palpations: Abdomen is soft. There is no mass.     Tenderness: There is no abdominal tenderness. There is no guarding or rebound.  Musculoskeletal:        General: Normal range of motion.     Cervical back: Normal range of motion and neck supple.  Lymphadenopathy:     Cervical: No cervical adenopathy.  Skin:    General: Skin is warm and dry.  Neurological:     General: No focal deficit present.     Mental Status: He is alert and oriented to person, place, and time. Mental status is at baseline.  Psychiatric:        Mood and Affect: Mood normal.        Behavior: Behavior normal.        Thought Content: Thought content normal.        Judgment: Judgment normal.    Assessment and Plan: Routine general medical examination at a health care facility Assessment & Plan: age appropriate education and counseling updated, referrals for preventative services and immunizations addressed, dietary and smoking counseling addressed, most recent labs reviewed.  I have personally reviewed and have noted:   1) the patient's medical and social history 2) The pt's use of alcohol, tobacco, and illicit drugs 3) The patient's current medications and supplements 4) Functional ability including ADL's, fall risk, home safety risk, hearing and  visual impairment 5) Diet and physical activities 6) Evidence for depression or mood disorder 7) The patient's height, weight, and BMI have been recorded in the chart   I have made referrals, and provided counseling and education based on review of the above    Prostate cancer screening -     PSA; Future  Mixed hyperlipidemia -     Lipid panel; Future -     LDL cholesterol, direct; Future  Impaired fasting glucose -     Hemoglobin A1c; Future  Other fatigue  Neoplasm of uncertain behavior of skin of face -     Ambulatory referral to Dermatology  Unintentional weight  loss -     Comprehensive metabolic panel; Future -     CBC with Differential/Platelet; Future -     TSH; Future  Rectal bleeding -     Iron, TIBC and Ferritin Panel  Need for influenza vaccination -     Flu vaccine trivalent PF, 6mos and older(Flulaval,Afluria,Fluarix,Fluzone)  Skin lesion of face Assessment & Plan: Lesions worrsiome for early SCC vs AK.  Referring to dermatology     No follow-ups on file.  Sherlene Shams, MD

## 2023-05-25 NOTE — Assessment & Plan Note (Signed)
Lesions worrsiome for early SCC vs AK.  Referring to dermatology

## 2023-05-29 ENCOUNTER — Other Ambulatory Visit (INDEPENDENT_AMBULATORY_CARE_PROVIDER_SITE_OTHER): Payer: Self-pay

## 2023-05-29 DIAGNOSIS — R634 Abnormal weight loss: Secondary | ICD-10-CM

## 2023-05-29 DIAGNOSIS — E782 Mixed hyperlipidemia: Secondary | ICD-10-CM

## 2023-05-29 DIAGNOSIS — R972 Elevated prostate specific antigen [PSA]: Secondary | ICD-10-CM

## 2023-05-29 DIAGNOSIS — Z125 Encounter for screening for malignant neoplasm of prostate: Secondary | ICD-10-CM

## 2023-05-29 DIAGNOSIS — R7301 Impaired fasting glucose: Secondary | ICD-10-CM

## 2023-05-29 LAB — LIPID PANEL
Cholesterol: 150 mg/dL (ref 0–200)
HDL: 66.8 mg/dL (ref >39.00)
LDL Cholesterol: 71 mg/dL (ref 0–99)
NonHDL: 83.21
Total CHOL/HDL Ratio: 2
Triglycerides: 62 mg/dL (ref 0.0–149.0)
VLDL: 12.4 mg/dL (ref 0.0–40.0)

## 2023-05-29 LAB — COMPREHENSIVE METABOLIC PANEL
ALT: 31 U/L (ref 0–53)
AST: 22 U/L (ref 0–37)
Albumin: 4 g/dL (ref 3.5–5.2)
Alkaline Phosphatase: 46 U/L (ref 39–117)
BUN: 9 mg/dL (ref 6–23)
CO2: 29 meq/L (ref 19–32)
Calcium: 9.1 mg/dL (ref 8.4–10.5)
Chloride: 104 meq/L (ref 96–112)
Creatinine, Ser: 0.97 mg/dL (ref 0.40–1.50)
GFR: 83.83 mL/min (ref >60.00)
Glucose, Bld: 94 mg/dL (ref 70–99)
Potassium: 4.6 meq/L (ref 3.5–5.1)
Sodium: 139 meq/L (ref 135–145)
Total Bilirubin: 1 mg/dL (ref 0.2–1.2)
Total Protein: 6.3 g/dL (ref 6.0–8.3)

## 2023-05-29 LAB — CBC WITH DIFFERENTIAL/PLATELET
Basophils Absolute: 0 10*3/uL (ref 0.0–0.1)
Basophils Relative: 0.4 % (ref 0.0–3.0)
Eosinophils Absolute: 0.1 10*3/uL (ref 0.0–0.7)
Eosinophils Relative: 1.1 % (ref 0.0–5.0)
HCT: 45.4 % (ref 39.0–52.0)
Hemoglobin: 15.1 g/dL (ref 13.0–17.0)
Lymphocytes Relative: 25.2 % (ref 12.0–46.0)
Lymphs Abs: 1.5 10*3/uL (ref 0.7–4.0)
MCHC: 33.3 g/dL (ref 30.0–36.0)
MCV: 99.2 fL (ref 78.0–100.0)
Monocytes Absolute: 0.7 10*3/uL (ref 0.1–1.0)
Monocytes Relative: 12.1 % — ABNORMAL HIGH (ref 3.0–12.0)
Neutro Abs: 3.5 10*3/uL (ref 1.4–7.7)
Neutrophils Relative %: 61.2 % (ref 43.0–77.0)
Platelets: 226 10*3/uL (ref 150.0–400.0)
RBC: 4.58 Mil/uL (ref 4.22–5.81)
RDW: 12.7 % (ref 11.5–15.5)
WBC: 5.8 10*3/uL (ref 4.0–10.5)

## 2023-05-29 LAB — HEMOGLOBIN A1C: Hgb A1c MFr Bld: 5.6 % (ref 4.6–6.5)

## 2023-05-29 LAB — LDL CHOLESTEROL, DIRECT: Direct LDL: 71 mg/dL

## 2023-05-29 LAB — TSH: TSH: 1.45 u[IU]/mL (ref 0.35–5.50)

## 2023-05-29 LAB — PSA: PSA: 3.75 ng/mL (ref 0.10–4.00)

## 2023-05-31 ENCOUNTER — Telehealth: Payer: Self-pay

## 2023-05-31 ENCOUNTER — Telehealth: Payer: Self-pay | Admitting: Internal Medicine

## 2023-05-31 NOTE — Telephone Encounter (Signed)
LMTCB

## 2023-05-31 NOTE — Telephone Encounter (Signed)
LMTCB in regards to lab results.  

## 2023-05-31 NOTE — Telephone Encounter (Signed)
-----   Message from Sherlene Shams sent at 05/30/2023 10:31 PM EST ----- Your labs are all normal, except your PSA , which although normal,  has nearly doubled over the past year.  PSA velocity of this degree is associated with an increased risk of prostate cancer; therefore I am recommending an evaluation by a urologist and will start the referral once I hear from you that you agree to the referral.  If you have a preference to whom you are referred,  please let me know.   Regards,   Duncan Dull, MD

## 2023-05-31 NOTE — Telephone Encounter (Signed)
Pt spouse called stating the pt just saw the provider and did not mention that he had a sinus infection. So the pt spouse want to see if the provider can call him in an antibiotic

## 2023-06-03 NOTE — Telephone Encounter (Signed)
noted 

## 2023-06-03 NOTE — Telephone Encounter (Signed)
Patient's wife called and note was read. Patient's wife said he has been laying around all weekend, 99.2 temp, blowing green mucus and cough, a lot at night.

## 2023-06-03 NOTE — Telephone Encounter (Signed)
His wife stated the patient does have a little blood in his mucus. I informed his wife that he will need an appointment. 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

## 2023-06-11 NOTE — Addendum Note (Signed)
Addended by: Sherlene Shams on: 06/11/2023 10:37 PM   Modules accepted: Orders

## 2023-06-26 NOTE — Progress Notes (Unsigned)
07/01/2023 1:36 PM   Frank Perkins 26-Aug-1960 [id number]  Referring provider: Sherlene Shams, MD [address] [place],  [place] [address]  Urological history: 1.  Erectile dysfunction -Contributing factors of age, BPH and alcohol consumption -sildenafil 20 mg, on-demand-dosing   2. BPH with LU TS -PSA (3.75)  -Tamsulosin 0.4 mg daily  No chief complaint on file.  HPI: Frank Perkins is a 62 y.o. male who presents today for increase in PSA velocity.    Previous records reviewed.    PSA  Latest Ref Rng 0.10 - 4.00 ng/mL  06/02/2019 2.03   10/21/2020 2.08   05/29/2023 3.75     I PSS ***       Score:  1-7 Mild 8-19 Moderate 20-35 Severe   SHIM ***    Score: 1-7 Severe ED 8-11 Moderate ED 12-16 Mild-Moderate ED 17-21 Mild ED 22-25 No ED    PMH: Past Medical History:  Diagnosis Date   Bladder outlet obstruction    BPH (benign prostatic hyperplasia)    ED (erectile dysfunction)    GERD (gastroesophageal reflux disease)    Prostatitis     Surgical History: Past Surgical History:  Procedure Laterality Date   NASAL SINUS SURGERY     est ent Dr. Elenore Rota    Home Medications:  Allergies as of 07/01/2023   No Known Allergies      Medication List        Accurate as of June 26, 2023  1:36 PM. If you have any questions, ask your nurse or doctor.          esomeprazole 20 MG capsule Commonly known as: NEXIUM Take 20 mg by mouth daily at 12 noon.   fluticasone 50 MCG/ACT nasal spray Commonly known as: FLONASE Place 2 sprays into both nostrils daily.   ipratropium 0.06 % nasal spray Commonly known as: ATROVENT Place 2 sprays into both nostrils 4 (four) times daily.   sildenafil 20 MG tablet Commonly known as: REVATIO TAKE 3 TO 5 TABLETS TWO HOURS PRIOR TO INTERCOURSE ON AN EMPTY STOMACH.   sodium chloride 0.65 % Soln nasal spray Commonly known as: OCEAN Place 2 sprays into both nostrils as needed for  congestion.   tamsulosin 0.4 MG Caps capsule Commonly known as: FLOMAX TAKE 1 CAPSULE BY MOUTH EVERY DAY        Allergies: No Known Allergies  Family History: Family History  Problem Relation Age of Onset   Cancer Father 46       prostate Ca and throat ca    Prostate cancer Father    Dementia Father        died of dementia complications    Esophageal cancer Father    Healthy Mother    Healthy Sister    Healthy Brother    Bladder Cancer Neg Hx    Kidney cancer Neg Hx    Kidney disease Neg Hx    Sickle cell trait Neg Hx    Tuberculosis Neg Hx     Social History:  reports that he has never smoked. His smokeless tobacco use includes chew. He reports current alcohol use. He reports that he does not use drugs.  ROS: Pertinent ROS in HPI  Physical Exam: There were no vitals taken for this visit.  Constitutional:  Well nourished. Alert and oriented, No acute distress. HEENT: [place] AT, moist mucus membranes.  Trachea midline, no masses. Cardiovascular: No clubbing, cyanosis, or edema. Respiratory: Normal respiratory effort, no  increased work of breathing. GI: Abdomen is soft, non tender, non distended, no abdominal masses. Liver and spleen not palpable.  No hernias appreciated.  Stool sample for occult testing is not indicated.   GU: No CVA tenderness.  No bladder fullness or masses.  Patient with circumcised/uncircumcised phallus. ***Foreskin easily retracted***  Urethral meatus is patent.  No penile discharge. No penile lesions or rashes. Scrotum without lesions, cysts, rashes and/or edema.  Testicles are located scrotally bilaterally. No masses are appreciated in the testicles. Left and right epididymis are normal. Rectal: Patient with  normal sphincter tone. Anus and perineum without scarring or rashes. No rectal masses are appreciated. Prostate is approximately *** grams, *** nodules are appreciated. Seminal vesicles are normal. Skin: No rashes, bruises or suspicious  lesions. Lymph: No cervical or inguinal adenopathy. Neurologic: Grossly intact, no focal deficits, moving all 4 extremities. Psychiatric: Normal mood and affect.  Laboratory Data: Lab Results  Component Value Date   WBC 5.8 05/29/2023   HGB 15.1 05/29/2023   HCT 45.4 05/29/2023   MCV 99.2 05/29/2023   PLT 226.0 05/29/2023    Lab Results  Component Value Date   CREATININE 0.97 05/29/2023    Lab Results  Component Value Date   PSA 3.75 05/29/2023   PSA 2.08 10/21/2020   PSA 2.03 06/02/2019    Lab Results  Component Value Date   HGBA1C 5.6 05/29/2023    Lab Results  Component Value Date   TSH 1.45 05/29/2023       Component Value Date/Time   CHOL 150 05/29/2023 0726   HDL 66.80 05/29/2023 0726   CHOLHDL 2 05/29/2023 0726   VLDL 12.4 05/29/2023 0726   LDLCALC 71 05/29/2023 0726    Lab Results  Component Value Date   AST 22 05/29/2023   Lab Results  Component Value Date   ALT 31 05/29/2023  I have reviewed the labs.   Pertinent Imaging: *** I have independently reviewed the films.    Assessment & Plan:  ***  1. Increase in PSA  -his father was diagnosed with prostate cancer at age 70 -discussed with patient that with a newly elevated PSA, 25% to 40% of the time it will return to baseline levels and for that reason it is advised to repeat the levels in 8 weeks (per 2023 AUA guidelines)  -We reviewed the implications of an elevated PSA and the uncertainty surrounding it. In general, a man's PSA increases with age and is produced by both normal and cancerous prostate tissue. -The differential diagnosis for elevated PSA includes BPH, prostate cancer, infection, recent intercourse/ejaculation, recent urethroscopic manipulation (foley placement/cystoscopy) or trauma, and prostatitis  -Management of an elevated PSA can include observation or prostate biopsy and we discussed this in detail. Our goal is to detect clinically significant prostate cancers, and manage  with either active surveillance, surgery, or radiation for localized disease. Risks of prostate biopsy include bleeding, infection (including life threatening sepsis), pain, and lower urinary symptoms. Hematuria, hematospermia, and blood in the stool are all common after biopsy and can persist up to 4 weeks ***   2. BPH with LU TS -PSA stable *** -DRE benign *** -UA benign *** -PVR < 300 cc *** -symptoms - *** -most bothersome symptoms are *** -continue conservative management, avoiding bladder irritants and timed voiding's -Initiate alpha-blocker (***), discussed side effects *** -Initiate 5 alpha reductase inhibitor (***), discussed side effects *** -Continue tamsulosin 0.4 mg daily, alfuzosin 10 mg daily, Rapaflo 8 mg daily, terazosin, doxazosin,  Cialis 5 mg daily and finasteride 5 mg daily, dutasteride 0.5 mg daily***:refills given -Cannot tolerate medication or medication failure, schedule cystoscopy ***  3. ED - I explained to the patient that in order to achieve an erection it takes good functioning of the nervous system (parasympathetic and rs, sympathetic, sensory and motor), good blood flow into the erectile tissue of the penis and a desire to have sex - I explained that conditions like diabetes, hypertension, coronary artery disease, peripheral vascular disease, smoking, alcohol consumption, age, sleep apnea and BPH can diminish the ability to have an erection - I explained the ED may be a risk marker for underlying CVD and he should follow up with PCP for further studies *** - we will obtain a serum testosterone level at this time; if it is abnormal we will need to repeat the study for confirmation *** - A recent study published in Sex Med 2018 Apr 13 revealed moderate to vigorous aerobic exercise for 40 minutes 4 times per week can decrease erectile problems caused by physical inactivity, obesity, hypertension, metabolic syndrome and/or cardiovascular diseases *** - We discussed  trying a *** different PDE5 inhibitor, intra-urethral suppositories, intracavernous vasoactive drug injection therapy, vacuum erection devices, LI-ESWT and penile prosthesis implantation    No follow-ups on file.  These notes generated with voice recognition software. I apologize for typographical errors.  Cloretta Ned  [facility] [address]  [address] [place], [place] [address] [phone]

## 2023-07-01 ENCOUNTER — Ambulatory Visit: Payer: Self-pay | Admitting: Urology

## 2023-07-01 DIAGNOSIS — N529 Male erectile dysfunction, unspecified: Secondary | ICD-10-CM

## 2023-07-01 DIAGNOSIS — N138 Other obstructive and reflux uropathy: Secondary | ICD-10-CM

## 2023-07-01 DIAGNOSIS — R972 Elevated prostate specific antigen [PSA]: Secondary | ICD-10-CM

## 2023-07-31 ENCOUNTER — Ambulatory Visit: Payer: Self-pay | Admitting: Urology

## 2023-09-25 ENCOUNTER — Other Ambulatory Visit: Payer: Self-pay | Admitting: Internal Medicine

## 2024-02-29 LAB — COLOGUARD: COLOGUARD: NEGATIVE

## 2024-06-28 ENCOUNTER — Other Ambulatory Visit: Payer: Self-pay | Admitting: Internal Medicine

## 2024-06-29 NOTE — Telephone Encounter (Signed)
 Called pt and wife Murray answered the phone. She stated she was on pts DPR. I checked the normal location and it is not listed there. I did check scanned in material and found a DPR where wife is listed. Was able to speak to wife and get pt scheduled as his LOV was back in 2024. Informed the wife that enough tablets would be filled to cover until that appt .

## 2024-07-03 ENCOUNTER — Other Ambulatory Visit: Payer: Self-pay | Admitting: Internal Medicine

## 2024-07-03 DIAGNOSIS — N529 Male erectile dysfunction, unspecified: Secondary | ICD-10-CM

## 2024-07-03 MED ORDER — SILDENAFIL CITRATE 20 MG PO TABS: ORAL_TABLET | ORAL | 5 refills | Status: AC

## 2024-07-03 NOTE — Telephone Encounter (Signed)
 Refilled 2 years ago by Kenney Roys, NP. Is it okay to refill?   Last OV: 05/23/2023 Next OV: 07/27/2024

## 2024-07-22 ENCOUNTER — Other Ambulatory Visit: Payer: Self-pay | Admitting: Internal Medicine

## 2024-07-27 ENCOUNTER — Encounter: Payer: Self-pay | Admitting: Internal Medicine

## 2024-07-27 ENCOUNTER — Ambulatory Visit (INDEPENDENT_AMBULATORY_CARE_PROVIDER_SITE_OTHER): Admitting: Internal Medicine

## 2024-07-27 VITALS — BP 144/74 | HR 94 | Ht 70.0 in | Wt 175.0 lb

## 2024-07-27 DIAGNOSIS — G8929 Other chronic pain: Secondary | ICD-10-CM

## 2024-07-27 DIAGNOSIS — R7301 Impaired fasting glucose: Secondary | ICD-10-CM | POA: Diagnosis not present

## 2024-07-27 DIAGNOSIS — K429 Umbilical hernia without obstruction or gangrene: Secondary | ICD-10-CM

## 2024-07-27 DIAGNOSIS — E782 Mixed hyperlipidemia: Secondary | ICD-10-CM

## 2024-07-27 DIAGNOSIS — L989 Disorder of the skin and subcutaneous tissue, unspecified: Secondary | ICD-10-CM

## 2024-07-27 DIAGNOSIS — Z23 Encounter for immunization: Secondary | ICD-10-CM

## 2024-07-27 DIAGNOSIS — G4762 Sleep related leg cramps: Secondary | ICD-10-CM | POA: Diagnosis not present

## 2024-07-27 DIAGNOSIS — M545 Low back pain, unspecified: Secondary | ICD-10-CM

## 2024-07-27 DIAGNOSIS — M549 Dorsalgia, unspecified: Secondary | ICD-10-CM | POA: Insufficient documentation

## 2024-07-27 DIAGNOSIS — Z0000 Encounter for general adult medical examination without abnormal findings: Secondary | ICD-10-CM

## 2024-07-27 DIAGNOSIS — Z125 Encounter for screening for malignant neoplasm of prostate: Secondary | ICD-10-CM

## 2024-07-27 DIAGNOSIS — F1090 Alcohol use, unspecified, uncomplicated: Secondary | ICD-10-CM

## 2024-07-27 DIAGNOSIS — E538 Deficiency of other specified B group vitamins: Secondary | ICD-10-CM | POA: Diagnosis not present

## 2024-07-27 DIAGNOSIS — R030 Elevated blood-pressure reading, without diagnosis of hypertension: Secondary | ICD-10-CM

## 2024-07-27 DIAGNOSIS — R634 Abnormal weight loss: Secondary | ICD-10-CM | POA: Diagnosis not present

## 2024-07-27 NOTE — Assessment & Plan Note (Signed)
Rarely

## 2024-07-27 NOTE — Progress Notes (Signed)
 Patient ID: Frank Perkins, male    DOB: 16-Jan-1961  Age: 64 y.o. MRN: [id number]  The patient is here for annual preventive examination and management of other chronic and acute problems.   The risk factors are reflected in the social history.   The roster of all physicians providing medical care to patient - is listed in the Snapshot section of the chart.   Activities of daily living:  The patient is 100% independent in all ADLs: dressing, toileting, feeding as well as independent mobility   Home safety : The patient has smoke detectors in the home. They wear seatbelts.  There are no unsecured firearms at home. There is no violence in the home.    There is no risks for hepatitis, STDs or HIV. There is no   history of blood transfusion. They have no travel history to infectious disease endemic areas of the world.   The patient has seen their dentist in the last six month. They have seen their eye doctor in the last year. The patinet  denies slight hearing difficulty with regard to whispered voices and some television programs.  They have deferred audiologic testing in the last year.  They do not  have excessive sun exposure. Discussed the need for sun protection: hats, long sleeves and use of sunscreen if there is significant sun exposure.    Diet: the importance of a healthy diet is discussed. They do have a healthy diet.   The benefits of regular aerobic exercise were discussed. The patient  exercises  3 to 5 days per week  for  60 minutes.    Depression screen: there are no signs or vegative symptoms of depression- irritability, change in appetite, anhedonia, sadness/tearfullness.   The following portions of the patient's history were reviewed and updated as appropriate: allergies, current medications, past family history, past medical history,  past surgical history, past social history  and problem list.   Visual acuity was not assessed per patient preference since the patient has  regular follow up with an  ophthalmologist. Hearing and body mass index were assessed and reviewed.    During the course of the visit the patient was educated and counseled about appropriate screening and preventive services including : fall prevention , diabetes screening, nutrition counseling, colorectal cancer screening, and recommended immunizations.    Chief Complaint:   none    Review of Symptoms  Patient denies headache, fevers, malaise, unintentional weight loss, skin rash, eye pain, sinus congestion and sinus pain, sore throat, dysphagia,  hemoptysis , cough, dyspnea, wheezing, chest pain, palpitations, orthopnea, edema, abdominal pain, nausea, melena, diarrhea, constipation, flank pain, dysuria, hematuria, urinary  Frequency, nocturia, numbness, tingling, seizures,  Focal weakness, Loss of consciousness,  Tremor, insomnia, depression, anxiety, and suicidal ideation.    Physical Exam:  BP (!) 144/74   Pulse 94   Ht 5' 10 (1.778 m)   Wt 175 lb (79.4 kg)   SpO2 95%   BMI 25.11 kg/m    Physical Exam Vitals reviewed.  Constitutional:      General: He is not in acute distress.    Appearance: Normal appearance. He is normal weight. He is not ill-appearing, toxic-appearing or diaphoretic.  HENT:     Head: Normocephalic and atraumatic.     Right Ear: Tympanic membrane, ear canal and external ear normal. There is no impacted cerumen.     Left Ear: Tympanic membrane, ear canal and external ear normal. There is no impacted cerumen.  Nose: Nose normal.     Mouth/Throat:     Mouth: Mucous membranes are moist.     Pharynx: Oropharynx is clear.  Eyes:     General: No scleral icterus.       Right eye: No discharge.        Left eye: No discharge.     Conjunctiva/sclera: Conjunctivae normal.  Neck:     Thyroid : No thyromegaly.     Vascular: No carotid bruit or JVD.  Cardiovascular:     Rate and Rhythm: Normal rate and regular rhythm.     Heart sounds: Normal heart sounds.   Pulmonary:     Effort: Pulmonary effort is normal. No respiratory distress.     Breath sounds: Normal breath sounds.  Abdominal:     General: Bowel sounds are normal.     Palpations: Abdomen is soft. There is no mass.     Tenderness: There is no abdominal tenderness. There is no guarding or rebound.     Hernia: A hernia is present.      Comments: Nonredicble umbilical hernia   Musculoskeletal:        General: Normal range of motion.     Cervical back: Normal range of motion and neck supple.  Lymphadenopathy:     Cervical: No cervical adenopathy.  Skin:    General: Skin is warm and dry.  Neurological:     General: No focal deficit present.     Mental Status: He is alert and oriented to person, place, and time. Mental status is at baseline.  Psychiatric:        Mood and Affect: Mood normal.        Behavior: Behavior normal.        Thought Content: Thought content normal.        Judgment: Judgment normal.      Assessment and Plan: Elevated blood pressure reading in office without diagnosis of hypertension -     Microalbumin / creatinine urine ratio; Future  Mixed hyperlipidemia -     Lipid panel; Future -     LDL cholesterol, direct; Future  Unintentional weight loss -     Comprehensive metabolic panel with GFR; Future -     CBC with Differential/Platelet; Future -     TSH; Future  Impaired fasting glucose -     Hemoglobin A1c; Future  Prostate cancer screening -     PSA; Future  Alcohol use Assessment & Plan: He reports an average of 4 drinks daily .  Advised that moderate consumption limits intake to 2 daily  ,     Chronic low back pain without sciatica, unspecified back pain laterality  Nocturnal leg cramps Assessment & Plan: Rarely.     B12 deficiency -     B12 and Folate Panel; Future  Skin lesion of face Assessment & Plan: He has a nonhealing scaling lesion at right temple concerning for SCC.  Dermatology referral made   Orders: -      Ambulatory referral to Dermatology  Umbilical hernia without obstruction and without gangrene -     Ambulatory referral to General Surgery  Need for influenza vaccination -     Flu vaccine trivalent PF, 6mos and older(Flulaval,Afluria,Fluarix,Fluzone)  Encounter for preventive health examination Assessment & Plan: age appropriate education and counseling updated, referrals for preventative services and immunizations addressed, dietary and smoking counseling addressed, most recent labs reviewed.  I have personally reviewed and have noted:   1) the patient's medical and social  history 2) The pt's use of alcohol, tobacco, and illicit drugs 3) The patient's current medications and supplements 4) Functional ability including ADL's, fall risk, home safety risk, hearing and visual impairment 5) Diet and physical activities 6) Evidence for depression or mood disorder 7) The patient's height, weight, and BMI have been recorded in the chart    I have made referrals, and provided counseling and education based on review of the above      Return in about 6 months (around 01/24/2025).  Verneita LITTIE Kettering, MD

## 2024-07-27 NOTE — Assessment & Plan Note (Signed)

## 2024-07-27 NOTE — Assessment & Plan Note (Addendum)
 He reports an average of 4 drinks daily .  Advised that moderate consumption limits intake to 2 daily  ,

## 2024-07-27 NOTE — Assessment & Plan Note (Signed)
 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  Dermatology referral made

## 2024-07-27 NOTE — Patient Instructions (Addendum)
 You have  an umbilical hernia  and a precancerous skin lesion on your right temple  Referrals to [facility] (FOR THE HERNIA) and  Dermatology (FOR THE SKIN CANCER ) will be made   You need an annual eye exam .  Consider a hearing  test   Fasting labs  required, including a urine test for protein,; we need a 4 hour window of no food ,  no  beverages containing  cream or sugar

## 2024-07-28 ENCOUNTER — Other Ambulatory Visit: Payer: Self-pay

## 2024-07-28 DIAGNOSIS — E538 Deficiency of other specified B group vitamins: Secondary | ICD-10-CM

## 2024-07-28 DIAGNOSIS — Z125 Encounter for screening for malignant neoplasm of prostate: Secondary | ICD-10-CM

## 2024-07-28 DIAGNOSIS — R030 Elevated blood-pressure reading, without diagnosis of hypertension: Secondary | ICD-10-CM

## 2024-07-28 DIAGNOSIS — E782 Mixed hyperlipidemia: Secondary | ICD-10-CM

## 2024-07-28 DIAGNOSIS — R634 Abnormal weight loss: Secondary | ICD-10-CM

## 2024-07-28 DIAGNOSIS — R7301 Impaired fasting glucose: Secondary | ICD-10-CM

## 2024-07-28 LAB — CBC WITH DIFFERENTIAL/PLATELET
Basophils Absolute: 0 K/uL (ref 0.0–0.1)
Basophils Relative: 0.1 % (ref 0.0–3.0)
Eosinophils Absolute: 0 K/uL (ref 0.0–0.7)
Eosinophils Relative: 0.6 % (ref 0.0–5.0)
HCT: 45 % (ref 39.0–52.0)
Hemoglobin: 15.6 g/dL (ref 13.0–17.0)
Lymphocytes Relative: 22.6 % (ref 12.0–46.0)
Lymphs Abs: 1.4 K/uL (ref 0.7–4.0)
MCHC: 34.5 g/dL (ref 30.0–36.0)
MCV: 96 fl (ref 78.0–100.0)
Monocytes Absolute: 0.7 K/uL (ref 0.1–1.0)
Monocytes Relative: 11.8 % (ref 3.0–12.0)
Neutro Abs: 4 K/uL (ref 1.4–7.7)
Neutrophils Relative %: 64.9 % (ref 43.0–77.0)
Platelets: 216 K/uL (ref 150.0–400.0)
RBC: 4.69 Mil/uL (ref 4.22–5.81)
RDW: 12.1 % (ref 11.5–15.5)
WBC: 6.1 K/uL (ref 4.0–10.5)

## 2024-07-28 LAB — B12 AND FOLATE PANEL
Folate: 10.6 ng/mL
Vitamin B-12: 304 pg/mL (ref 211–911)

## 2024-07-28 LAB — COMPREHENSIVE METABOLIC PANEL WITH GFR
ALT: 25 U/L (ref 3–53)
AST: 21 U/L (ref 5–37)
Albumin: 4.4 g/dL (ref 3.5–5.2)
Alkaline Phosphatase: 39 U/L (ref 39–117)
BUN: 14 mg/dL (ref 6–23)
CO2: 28 meq/L (ref 19–32)
Calcium: 9.2 mg/dL (ref 8.4–10.5)
Chloride: 104 meq/L (ref 96–112)
Creatinine, Ser: 1.04 mg/dL (ref 0.40–1.50)
GFR: 76.48 mL/min
Glucose, Bld: 107 mg/dL — ABNORMAL HIGH (ref 70–99)
Potassium: 4.4 meq/L (ref 3.5–5.1)
Sodium: 139 meq/L (ref 135–145)
Total Bilirubin: 1.2 mg/dL (ref 0.2–1.2)
Total Protein: 6.7 g/dL (ref 6.0–8.3)

## 2024-07-28 LAB — LIPID PANEL
Cholesterol: 175 mg/dL (ref 28–200)
HDL: 77.7 mg/dL
LDL Cholesterol: 88 mg/dL (ref 10–99)
NonHDL: 97.46
Total CHOL/HDL Ratio: 2
Triglycerides: 47 mg/dL (ref 10.0–149.0)
VLDL: 9.4 mg/dL (ref 0.0–40.0)

## 2024-07-28 LAB — MICROALBUMIN / CREATININE URINE RATIO
Creatinine,U: 111.1 mg/dL
Microalb Creat Ratio: UNDETERMINED mg/g (ref 0.0–30.0)
Microalb, Ur: <0.7 mg/dL

## 2024-07-28 LAB — TSH: TSH: 1.71 u[IU]/mL (ref 0.35–5.50)

## 2024-07-28 LAB — HEMOGLOBIN A1C: Hgb A1c MFr Bld: 5.6 % (ref 4.6–6.5)

## 2024-07-28 LAB — PSA: PSA: 6.1 ng/mL — ABNORMAL HIGH (ref 0.10–4.00)

## 2024-07-28 LAB — LDL CHOLESTEROL, DIRECT: Direct LDL: 85 mg/dL

## 2024-07-29 ENCOUNTER — Ambulatory Visit: Payer: Self-pay | Admitting: Internal Medicine
